# Patient Record
Sex: Female | Born: 1975 | Race: White | Hispanic: No | Marital: Married | Smoking: Never smoker
Health system: Southern US, Community
[De-identification: ages and names within clinical notes are randomized; demographics above are authoritative.]

## PROBLEM LIST (undated history)

## (undated) DIAGNOSIS — J302 Other seasonal allergic rhinitis: Secondary | ICD-10-CM

## (undated) DIAGNOSIS — C439 Malignant melanoma of skin, unspecified: Secondary | ICD-10-CM

## (undated) DIAGNOSIS — Z9889 Other specified postprocedural states: Secondary | ICD-10-CM

## (undated) DIAGNOSIS — Z8619 Personal history of other infectious and parasitic diseases: Secondary | ICD-10-CM

## (undated) HISTORY — PX: DILATION AND CURETTAGE OF UTERUS: SHX78

## (undated) HISTORY — PX: SKIN SURGERY: SHX2413

## (undated) HISTORY — DX: Personal history of other infectious and parasitic diseases: Z86.19

---

## 1999-05-18 ENCOUNTER — Other Ambulatory Visit: Admission: RE | Admit: 1999-05-18 | Discharge: 1999-05-18 | Payer: Self-pay | Admitting: *Deleted

## 2011-01-09 DIAGNOSIS — C439 Malignant melanoma of skin, unspecified: Secondary | ICD-10-CM

## 2011-01-09 HISTORY — DX: Malignant melanoma of skin, unspecified: C43.9

## 2013-01-08 DIAGNOSIS — Z9889 Other specified postprocedural states: Secondary | ICD-10-CM

## 2013-01-08 HISTORY — DX: Other specified postprocedural states: Z98.890

## 2013-08-26 LAB — OB RESULTS CONSOLE GC/CHLAMYDIA
Chlamydia: NEGATIVE
Gonorrhea: NEGATIVE

## 2013-09-05 ENCOUNTER — Ambulatory Visit (INDEPENDENT_AMBULATORY_CARE_PROVIDER_SITE_OTHER): Payer: 59 | Admitting: Physician Assistant

## 2013-09-05 VITALS — BP 124/84 | HR 88 | Temp 98.0°F | Resp 16 | Ht 69.0 in | Wt 171.6 lb

## 2013-09-05 DIAGNOSIS — R350 Frequency of micturition: Secondary | ICD-10-CM

## 2013-09-05 MED ORDER — CEPHALEXIN 500 MG PO CAPS: 500.0000 mg | ORAL_CAPSULE | Freq: Three times a day (TID) | ORAL | Status: DC

## 2013-09-05 NOTE — Progress Notes (Signed)
   Subjective:    Patient ID: [name], female    DOB: 12/09/1975, 38 y.o.   MRN: [id number]  HPI  Pt presents to clinic with concerns that her OB/GYN called her and told her she had a UTI and needed Keflex but she went to the pharmacy and it was not there.  She is [redacted] weeks pregnant and is concerned to go through the wknd without the abx when they called her and told her she needed the abx.  She is nauseated but otherwise feels good.  She is taking PNV.  She has no symptoms of UTI.  Review of Systems  Constitutional: Negative for fever and chills.  Genitourinary: Negative for dysuria, urgency and frequency.       Objective:   Physical Exam  Vitals reviewed. Constitutional: She is oriented to person, place, and time. She appears well-developed and well-nourished.  HENT:  Head: Normocephalic and atraumatic.  Right Ear: External ear normal.  Left Ear: External ear normal.  Pulmonary/Chest: Effort normal.  Neurological: She is alert and oriented to person, place, and time.  Skin: Skin is warm and dry.  Psychiatric: She has a normal mood and affect. Her behavior is normal. Judgment and thought content normal.       Assessment & Plan:  Frequent urination - Plan: cephALEXin (KEFLEX) 500 MG capsule  Due to a phone call from OB/GYN with culture results that showed a UTI that was sensitive to Keflex but they did not get the Rx to the pharmacy.  We will treat as she was instructed over the phone.  Windell Hummingbird PA-C  [facility] 09/05/2013 9:46 AM

## 2013-09-23 LAB — OB RESULTS CONSOLE RUBELLA ANTIBODY, IGM: Rubella: IMMUNE

## 2013-09-23 LAB — OB RESULTS CONSOLE ABO/RH: RH Type: POSITIVE

## 2013-09-23 LAB — OB RESULTS CONSOLE HIV ANTIBODY (ROUTINE TESTING): HIV: NONREACTIVE

## 2013-09-23 LAB — OB RESULTS CONSOLE RPR: RPR: NONREACTIVE

## 2013-09-23 LAB — OB RESULTS CONSOLE ANTIBODY SCREEN: ANTIBODY SCREEN: NEGATIVE

## 2013-09-23 LAB — OB RESULTS CONSOLE HEPATITIS B SURFACE ANTIGEN: Hepatitis B Surface Ag: NEGATIVE

## 2013-12-15 ENCOUNTER — Other Ambulatory Visit (HOSPITAL_COMMUNITY): Payer: Self-pay | Admitting: Obstetrics and Gynecology

## 2013-12-15 DIAGNOSIS — R9389 Abnormal findings on diagnostic imaging of other specified body structures: Secondary | ICD-10-CM

## 2013-12-22 ENCOUNTER — Ambulatory Visit (HOSPITAL_COMMUNITY): Payer: 59

## 2013-12-25 ENCOUNTER — Other Ambulatory Visit (HOSPITAL_COMMUNITY): Payer: Self-pay | Admitting: Obstetrics and Gynecology

## 2013-12-25 ENCOUNTER — Ambulatory Visit (HOSPITAL_COMMUNITY)
Admission: RE | Admit: 2013-12-25 | Discharge: 2013-12-25 | Disposition: A | Discharge status: Home / Self Care | Payer: 59 | Source: Ambulatory Visit | Attending: Obstetrics and Gynecology | Admitting: Obstetrics and Gynecology

## 2013-12-25 ENCOUNTER — Encounter (HOSPITAL_COMMUNITY): Payer: Self-pay

## 2013-12-25 DIAGNOSIS — IMO0002 Reserved for concepts with insufficient information to code with codable children: Secondary | ICD-10-CM | POA: Insufficient documentation

## 2013-12-25 DIAGNOSIS — O283 Abnormal ultrasonic finding on antenatal screening of mother: Secondary | ICD-10-CM | POA: Diagnosis not present

## 2013-12-25 DIAGNOSIS — O358XX0 Maternal care for other (suspected) fetal abnormality and damage, not applicable or unspecified: Secondary | ICD-10-CM | POA: Diagnosis not present

## 2013-12-25 DIAGNOSIS — O09522 Supervision of elderly multigravida, second trimester: Secondary | ICD-10-CM | POA: Diagnosis not present

## 2013-12-25 DIAGNOSIS — R9389 Abnormal findings on diagnostic imaging of other specified body structures: Secondary | ICD-10-CM

## 2013-12-25 DIAGNOSIS — Z3A24 24 weeks gestation of pregnancy: Secondary | ICD-10-CM | POA: Insufficient documentation

## 2013-12-25 DIAGNOSIS — Z315 Encounter for genetic counseling: Secondary | ICD-10-CM | POA: Insufficient documentation

## 2013-12-25 DIAGNOSIS — O4402 Placenta previa specified as without hemorrhage, second trimester: Secondary | ICD-10-CM | POA: Diagnosis not present

## 2013-12-25 DIAGNOSIS — IMO0001 Reserved for inherently not codable concepts without codable children: Secondary | ICD-10-CM

## 2013-12-25 DIAGNOSIS — O359XX1 Maternal care for (suspected) fetal abnormality and damage, unspecified, fetus 1: Secondary | ICD-10-CM

## 2013-12-25 DIAGNOSIS — O09529 Supervision of elderly multigravida, unspecified trimester: Secondary | ICD-10-CM | POA: Insufficient documentation

## 2013-12-25 NOTE — Progress Notes (Signed)
Genetic Counseling  High-Risk Gestation Note  Appointment Date:  12/25/2013 Referred By: Sabrina Cipro, MD Date of Birth:  1975/07/02 Partner:  [name]   Pregnancy History: [id number]  Estimated Date of Delivery: 04/12/14 Estimated Gestational Age: 6w4dAttending: MRenella Cunas MD   I met with Mrs. [name] her husband, [name] for genetic counseling because of abnormal ultrasound findings.  We began by reviewing the ultrasound in detail. Ultrasound performed today confirmed the findings of bilateral enlarged, echogenic kidneys. Remaining visualized fetal anatomy appeared normal, and amniotic fluid volume was visualized to be normal at the time of today's ultrasound. Complete ultrasound results reported separately.   We discussed the possible etiologies for echogenic fetal kidneys including normal variation, polycystic kidney disease, fetal aneuploidy, or other single gene conditions. We reviewed chromosomes, genes, and various patterns of inheritance. Ms. GHarneypreviously had noninvasive prenatal screening (NIPS)/cell free DNA testing performed through her OB office ([facility] through [facility]. We reviewed that these results were within normal limits. We reviewed the conditions for which it screened (trisomies 256 169 13, and sex chromosome aneuploidy) and the sensitivity and specificity for these conditions. They understand that NIPS is not diagnostic for these conditions, nor does it assess for all chromosome or genetic conditions.   We specifically reviewed the association with fetal echogenic kidneys and the chance for underlying polycystic kidney disease. We discussed that there are two types of polycystic kidney disease, delineated by the mode of inheritance: autosomal recessive polycystic kidney disease (ARPKD) and autosomal dominant polycystic kidney disease (ADPKD). ARPKD is characterized by both renal and liver disease, but other organ systems can be  variably affected.  The majority of individuals with ARPKD classically present during the fetal or neonatal period with enlarged echogenic kidneys.  Renal disease is characterized by nephromegaly, hypertension, and renal dysfunction.  Greater than 50% of individuals with ARPKD have end stage renal disease (ESRD) within the first decade of life.  ESRD often requires kidney transplantation.  We discussed that fetal onset of the condition can be associated with enlarged, echogenic kidneys and kidney dysfunction in utero, which can lead to oligohydramnios and pulmonary hypoplasia. Infants with prenatal oligohydramnios and pulmonary hypoplasia have significant risk for early death secondary to respiratory distress.  In addition, we discussed that ARPKD causes hepatobiliary disease, which is characterized by hepatomegaly, splenomegaly, and progressive portal hypertension.  We discussed that ARPKD shows significant variability in the age of onset and the presenting clinical features. Neonatal respiratory support and renal replacement therapies have been reported to increase the long-term survival for infants with the classic presentation of ARPKD to approximately greater than 80%.   We spent time reviewing the autosomal recessive inheritance of ARPKD.  PKHD1 is the only gene known to be associated with ARPKD.  They were counseled that parents of a child with ARPKD are typically carriers of the condition.  A carrier refers to an individual who has one altered gene and one typical functioning copy of the gene.  Carriers of ARPKD are healthy and have no features of ARPKD. Offspring of a carrier couple for an ARPKD would have a 1 in 4 (25%) chance to inherit the condition. We discussed the availability of prenatal diagnosis for ARPKD via amniocentesis via molecular testing for PKHD1.  Molecular testing for PKHD1 is estimated to detect causative genetic mutations in approximately 79-85% of individuals with a clinical  diagnosis of ARPKD. We discussed the risks, benefits, and limitations of amniocentesis including the associated 1 in 300-500  risk for complications, including spontaneous preterm labor and delivery. We also discussed the option of carrier screening for ARPKD. Carrier screening for PKHD1 is available via a pan-ethnic carrier screening panel, which also screens for additional autosomal recessive conditions (unrelated to the ultrasound findings in the pregnancy). We reviewed that carrier screening assesses for common disease causing mutations and thus does not detect all carriers of the condition. We dicussed that detection rate from carrier screening is approximately 45%. They understand that limitations of carrier screening versus assessing a symptomatic individual directly. After careful consideration, [name] carrier screening for ARPKD via pan-ethnic carrier screening panel at this time.   Autosomal dominant PKD (ADPKD) is generally a late-onset disorder characterized by progressive cyst development and bilaterally enlarged polycystic kidneys. These cysts can slowly replace much of the mass of the kidneys, reducing kidney function and leading to kidney failure.  Approximately 50% of individuals with ADPKD have end stage renal disease by age 95 years. End-stage renal disease is variable: the majority of individuals with PKD1 mutations experience ESRD, but many individuals with PKD2 mutations have adequate renal function into old age. The condition is substantially clinically variable, even within families.  We discussed that ADPKD typically has adult onset; however, 1-2% of patients present during the neonatal period, often with signs and symptoms indistinguishable from ARPKD. Fetal presentation of autosomal dominant kidney disease is less common, but is described and suspected particularly when a family history of ADPKD is also present. We also discussed that the absence of a family history of ADPKD  would not rule out the presence of ADPKD given that approximately 10% of cases occur due to a de novo mutation.   We reviewed autosomal dominant inheritance. In autosomal dominant inheritance, the presence of one genetic mutation in a particular gene pair causes the condition. A person who has a dominant genetic condition typically has symptoms of the disease, and each of their pregnancies has a 1 in 2 (50%) chance to inherit the condition. We discussed that each pregnancy for an individual with ADPKD has the same 1 in 2 chance to inherit the condition, regardless of whether or not other offspring have inherited the condition.  About 85% of cases result from an altered gene located on chromosome 16 (PKD1).  The remaining 15% of cases are thought to result from an altered gene on chromosome 4 (PKD2).  Genetic testing is available and is most informative by testing a symptomatic individual first. It is important to realize that genetic testing is not useful in predicting age of onset, severity, or rate of disease progression in individuals. The diagnosis of autosomal dominant polycystic kidney disease is established primarily by renal imaging studies and genetic testing can be used to confirm or establish an uncertain diagnosis.  For adult relatives at risk for ADPKD, initial evaluation offered is typically imaging with abdominal ultrasound, CT, or MRI, or if the familial mutation is known, molecular genetic testing may be performed.  We also discussed the option of molecular testing for PKD1 and/or PKD2 via amniocentesis in the pregnancy. The couple understands that amniocentesis would not diagnose or rule out all genetic conditions. We also discussed that in the case that polycystic kidney disease is present in the pregnancy, the distinction of ARPKD vs ADPKD would not likely significantly impact the postnatal medical management, which is rather dictated by the presenting features for the pregnancy/neonate.  After  careful consideration, [name] amniocentesis at this time given the associated risk of complications.  We discussed that enlarged, hyperechoic kidneys on prenatal ultrasound can also be associated with underlying fetal aneuploidy or other single gene conditions. We also discussed that the ultrasound finding of echogenic kidneys may be a normal variant.  We discussed that prognosis depends upon the underlying etiology of kidney echogenicity, as well as the kidney function and effects on amniotic fluid volume in pregnancy. We reviewed that increased kidney size and low amniotic fluid volume would be more associated with a potential poor prognosis. Recurrence risk for future pregnancies would depend upon the underlying cause for the enlarged, echogenic kidneys. Follow-up ultrasound was recommended in 2 weeks at the patient's OB office to assess amniotic fluid volume. Follow-up ultrasound was planned in 4 weeks (01/22/14) to reassess fetal kidneys, amniotic fluid, and fetal growth. We discussed the option of prenatal consultation with pediatric nephrology or pediatric urology. The couple expressed interest in this, and we will facilitate this for the patient.  They understand that ultrasound and screening do not diagnose or rule out all birth defects or genetic conditions.   Both family histories were reviewed and found to be noncontributory for polycystic kidney disease. The patient reported that her father had a history of hypertension and diabetes and died at age 31 years old from a stroke but was not reported to have concerns with his kidneys. [name], the father of the pregnancy reported no known individuals with polycystic kidney disease in the family history. The patient reported a female paternal first cousin with one kidney. She had limited information regarding this history and whether or not it was determined to be present at birth or later in life. He is otherwise reportedly healthy. 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 Renal agenesis is typically sporadic. However, there are familial cases reported that are consistent with autosomal dominant inheritance. Additionally, prenatal exposures have been reported to be associated with renal agenesis. We also discussed that renal agenesis is described as an underlying feature of many single gene conditions and can also be seen with chromosome conditions. We discussed that in the case of isolated renal agenesis, recurrence risk would be expected to be low for relatives, given that sporadic occurrence is observed in the majority of cases. We discussed that this reported family history would not likely be related to the ultrasound finding in the current pregnancy of echogenic, enlarged fetal kidneys. The family histories were otherwise unremarkable for birth defects, intellectual disability, and known genetic conditions. Consanguinity was denied. Without further information regarding the provided family history, an accurate genetic risk cannot be calculated. Further genetic counseling is warranted if more information is obtained.    I counseled this couple regarding the above risks and available options.  The approximate face-to-face time with the genetic counselor was 35 minutes.  Chipper Oman, MS Certified Genetic Counselor 12/25/2013

## 2013-12-26 DIAGNOSIS — O4440 Low lying placenta NOS or without hemorrhage, unspecified trimester: Secondary | ICD-10-CM | POA: Insufficient documentation

## 2013-12-26 DIAGNOSIS — O09529 Supervision of elderly multigravida, unspecified trimester: Secondary | ICD-10-CM | POA: Insufficient documentation

## 2013-12-26 DIAGNOSIS — Z3A24 24 weeks gestation of pregnancy: Secondary | ICD-10-CM | POA: Insufficient documentation

## 2013-12-26 DIAGNOSIS — O358XX0 Maternal care for other (suspected) fetal abnormality and damage, not applicable or unspecified: Secondary | ICD-10-CM | POA: Insufficient documentation

## 2013-12-26 DIAGNOSIS — O35EXX0 Maternal care for other (suspected) fetal abnormality and damage, fetal genitourinary anomalies, not applicable or unspecified: Secondary | ICD-10-CM | POA: Insufficient documentation

## 2013-12-28 ENCOUNTER — Telehealth (HOSPITAL_COMMUNITY): Payer: Self-pay | Admitting: MS"

## 2013-12-28 NOTE — Telephone Encounter (Signed)
Called Ms. [name] regarding prenatal consultation with pediatric nephrology. Discussed that this is scheduled with Dr. Bridgett Larsson, pediatric nephrologist at [place] [facility]. Appointment is scheduled for Thursday, January 14 at 3:45. Discussed that pediatric nephrology office will mail a packet of information to the patient regarding the upcoming appointment including directions. [name] had previously asked about who to contact with inquiries regarding insurance in regards to a prenatal consultation with pediatric nephrology at [facility]. Provided [name] with the contact number for the office of patient and family relations ([phone]).   [name] stated that she has been reading additional information over the weekend. She stated that she has been trying to contact her OB office this morning regarding scheduling the follow-up ultrasound to assess amniotic fluid volume. Discussed that this recommendation is also included in the ultrasound report from Friday, which was faxed to her OB office. Reviewed also that Dr. Burnett Harry will talk, if not already done, directly with either Artelia Laroche or Dr. Ronita Hipps regarding the patient's visit from 12/18.   [name] stated that her understanding is that there is not a definitive diagnosis but a suspected one based on the ultrasound findings. Reviewed that this is correct and that ultrasound is most suggestive of a form of polycystic kidney disease. We reviewed the option of amniocentesis. Patient inquired about the window of time available to do amnio, and we dicussed that there is not gestational age limit on when amniocentesis can be performed. Reviewed risk of complications including the risk for spontaneous labor and delivery. We reviewed the benefits and limitations of single gene testing from amniocentesis. [name] also inquired about testing for herself or her husband. Reviewed the option of carrier  screening for ARPKD via carrier screening panel. Reviewed the there is a wide range of features included on the panel and the chance to identify carrier status for an unrelated genetic condition. Reviewed that the detection rate for carriers of ARPKD from this carrier screen is approximately 50%. Thus, a negative screen would not rule out carrier status, and also not rule out the presence of ARPKD in the pregnancy. Provided [name] with information regarding one lab that performs this carrier screen ([facility] [employer]) so that she could read more and further consider this option.   Santiago Glad Maalik Pinn 12/28/2013 11:32 AM

## 2013-12-30 ENCOUNTER — Other Ambulatory Visit (HOSPITAL_COMMUNITY): Payer: Self-pay

## 2014-01-08 NOTE — L&D Delivery Note (Signed)
Delivery Note At 3:04 PM a viable and healthy female was delivered via  (Presentation: ROA ).  APGAR: 8, 9; weight  pending.   Placenta status: spontaneous, intact.  Cord:  with the following complications: none.  Cord pH: na NICU in attendance due to fetal history of possible PCKD.  Anesthesia:  local Episiotomy:  none Lacerations:  second Suture Repair: 2.0 vicryl rapide Est. Blood Loss (mL):  200  Mom to postpartum.  Baby to Nursery or possibly NICU to be determined.  [name] J 04/08/2014, 3:21 PM

## 2014-01-11 ENCOUNTER — Telehealth (HOSPITAL_COMMUNITY): Payer: Self-pay | Admitting: MS"

## 2014-01-11 NOTE — Telephone Encounter (Signed)
Called [name], per patient's request, to discuss information from the patient's visit with [facility]. The receptionist informed me that [name] is out of the office today but should be in later this week. I left a message for [name] stating that I was calling to discuss [name], given that Dr. Burnett Harry is on vacation this week, and asked for her to return call to me either at office number or cell phone, when she has a chance.   [name] 01/11/2014 12:08 PM

## 2014-01-11 NOTE — Telephone Encounter (Signed)
Ms. [name] called to follow-up. She stated that she met with her midwife and had ultrasound for amniotic fluid check, which looked good. Ms. [name] has concerns that information between the two offices is not adequately being shared. She stated that [name] and Dr. Burnett Santos have not yet had a chance to talk directly to each other regarding [name] visit in [facility]. Ms. [name] acknowledged that there have been two holidays which can sometimes through things off. I let Ms. [name] know that Dr. Burnett Santos is also on vacation this week. Ms. [name] asked if I could call over and speak directly with [name], given that I have access to the same notes and information from her visit with [place]. Discussed that I would plan to call over to touch base with Ms. [name].   Ms. [name] also inquired about her upcoming appointments. She is scheduled to meet with [name], Dr. Bridgett Santos on 01/21/14 and then has an ultrasound for growth scheduled 01/22/14 at [name]. Ms. [name] wanted clarification on why both appointments were needed. She asked if Santos was the expert then why could they not do diagnostic study. Explained that they do not do prenatal ultrasounds and that the appointment with nephrology is only a consult, primarily to review plans for medical care and treatment after delivery. Discussed that the ultrasound on 01/22/14 was for growth and to specifically reassess kidneys and amniotic fluid and that it was more detailed than an amniotic fluid check. Ms. [name] asked if the nephrology consult could then be moved until after the 01/22/14 ultrasound so that they have the most up to date information and images for the appointment. Discussed that it should be no problem to move that appointment until later. I planned to contact Dr. Lianne Santos office (pediatric nephrology) to reschedule appointment, and let Ms. [name] know that ultrasound images and  the report could be sent to Dr. Bridgett Santos quickly following the 01/22/14 ultrasound.   I planned to call Ms. [name] back on her cell phone with updates regarding this plan.   [name] 01/11/2014 12:06 PM

## 2014-01-12 ENCOUNTER — Telehealth (HOSPITAL_COMMUNITY): Payer: Self-pay | Admitting: MS"

## 2014-01-12 NOTE — Telephone Encounter (Signed)
Called Ms. Margarito Courser Basista back regarding rescheduled prenatal consultation with pediatric nephrology. Offered to call on her behalf or offer her number for her to call directly to reschedule. The patient stated that she would plan to call to reschedule. Contact number for [facility] scheduling is [phone]. Additionally, let patient know that I called over and left voicemail with Artelia Laroche but that she was on vacation yesterday and is supposed to be back in the office today. I will continue to try to touch base with Lavella Lemons at [place].   Also reviewed option of amniocentesis to test three genes associated with PKD. Patient stated that she remembered this option and remembered that detection rate, even in clinical diagnosis of PKD is not 100% and that amniocentesis is also associated with risk in pregnancy. Reviewed that this is correct and that postnatal genetic testing could also be performed. Also reviewed option of carrier screening for ARPKD that this is a noninvasive option but has lower sensitivity. Reviewed that this is available through a panel ([facility] through [place]) and that currently patient's with [employer] are billed up to approximately $100 for this test. Patient asked if it mattered if this testing was performed for female or female partner. Stated that it could be begun with either partner. Stated that I would confirm the current billing policy with the lab if they were considering this option.   Ms. Krauss had updates regarding the family history. She stated that she is still investigating the issues with blood pressure for her husband's other daughter and that her husband's father has diabetes insipidus.   Santiago Glad Aliya Sol 01/12/2014 1:31 PM

## 2014-01-22 ENCOUNTER — Ambulatory Visit (HOSPITAL_COMMUNITY)
Admission: RE | Admit: 2014-01-22 | Discharge: 2014-01-22 | Disposition: A | Discharge status: Home / Self Care | Payer: 59 | Source: Ambulatory Visit | Attending: Family Medicine | Admitting: Family Medicine

## 2014-01-22 ENCOUNTER — Other Ambulatory Visit (HOSPITAL_COMMUNITY): Payer: Self-pay | Admitting: Obstetrics and Gynecology

## 2014-01-22 DIAGNOSIS — O359XX1 Maternal care for (suspected) fetal abnormality and damage, unspecified, fetus 1: Secondary | ICD-10-CM

## 2014-01-22 DIAGNOSIS — O4400 Placenta previa specified as without hemorrhage, unspecified trimester: Secondary | ICD-10-CM | POA: Insufficient documentation

## 2014-01-22 DIAGNOSIS — O09522 Supervision of elderly multigravida, second trimester: Secondary | ICD-10-CM

## 2014-01-22 DIAGNOSIS — O358XX0 Maternal care for other (suspected) fetal abnormality and damage, not applicable or unspecified: Secondary | ICD-10-CM | POA: Diagnosis not present

## 2014-01-22 DIAGNOSIS — Z3A28 28 weeks gestation of pregnancy: Secondary | ICD-10-CM | POA: Insufficient documentation

## 2014-01-22 DIAGNOSIS — O4403 Placenta previa specified as without hemorrhage, third trimester: Secondary | ICD-10-CM | POA: Insufficient documentation

## 2014-01-22 DIAGNOSIS — O359XX0 Maternal care for (suspected) fetal abnormality and damage, unspecified, not applicable or unspecified: Secondary | ICD-10-CM | POA: Insufficient documentation

## 2014-01-22 DIAGNOSIS — O09523 Supervision of elderly multigravida, third trimester: Secondary | ICD-10-CM | POA: Insufficient documentation

## 2014-01-22 DIAGNOSIS — O4402 Placenta previa specified as without hemorrhage, second trimester: Secondary | ICD-10-CM

## 2014-01-25 ENCOUNTER — Other Ambulatory Visit (HOSPITAL_COMMUNITY): Payer: Self-pay | Admitting: Maternal and Fetal Medicine

## 2014-01-25 DIAGNOSIS — O4403 Placenta previa specified as without hemorrhage, third trimester: Secondary | ICD-10-CM

## 2014-01-25 DIAGNOSIS — O359XX0 Maternal care for (suspected) fetal abnormality and damage, unspecified, not applicable or unspecified: Secondary | ICD-10-CM

## 2014-01-25 DIAGNOSIS — O09523 Supervision of elderly multigravida, third trimester: Secondary | ICD-10-CM

## 2014-02-11 ENCOUNTER — Encounter (HOSPITAL_COMMUNITY): Payer: Self-pay

## 2014-02-18 DIAGNOSIS — O283 Abnormal ultrasonic finding on antenatal screening of mother: Secondary | ICD-10-CM | POA: Insufficient documentation

## 2014-02-19 ENCOUNTER — Ambulatory Visit (HOSPITAL_COMMUNITY)
Admission: RE | Admit: 2014-02-19 | Discharge: 2014-02-19 | Disposition: A | Discharge status: Home / Self Care | Payer: 59 | Source: Ambulatory Visit | Attending: Family Medicine | Admitting: Family Medicine

## 2014-02-19 ENCOUNTER — Encounter (HOSPITAL_COMMUNITY): Payer: Self-pay

## 2014-02-19 DIAGNOSIS — O35EXX0 Maternal care for other (suspected) fetal abnormality and damage, fetal genitourinary anomalies, not applicable or unspecified: Secondary | ICD-10-CM | POA: Insufficient documentation

## 2014-02-19 DIAGNOSIS — O09523 Supervision of elderly multigravida, third trimester: Secondary | ICD-10-CM | POA: Insufficient documentation

## 2014-02-19 DIAGNOSIS — Z3A32 32 weeks gestation of pregnancy: Secondary | ICD-10-CM | POA: Diagnosis not present

## 2014-02-19 DIAGNOSIS — O358XX0 Maternal care for other (suspected) fetal abnormality and damage, not applicable or unspecified: Secondary | ICD-10-CM | POA: Diagnosis present

## 2014-02-19 DIAGNOSIS — O4403 Placenta previa specified as without hemorrhage, third trimester: Secondary | ICD-10-CM

## 2014-02-19 DIAGNOSIS — O359XX0 Maternal care for (suspected) fetal abnormality and damage, unspecified, not applicable or unspecified: Secondary | ICD-10-CM

## 2014-02-19 DIAGNOSIS — O09529 Supervision of elderly multigravida, unspecified trimester: Secondary | ICD-10-CM | POA: Insufficient documentation

## 2014-03-10 LAB — OB RESULTS CONSOLE GBS: STREP GROUP B AG: NEGATIVE

## 2014-03-16 ENCOUNTER — Encounter (HOSPITAL_COMMUNITY): Payer: Self-pay

## 2014-04-02 ENCOUNTER — Telehealth (HOSPITAL_COMMUNITY): Payer: Self-pay | Admitting: *Deleted

## 2014-04-02 ENCOUNTER — Encounter (HOSPITAL_COMMUNITY): Payer: Self-pay | Admitting: *Deleted

## 2014-04-02 NOTE — Telephone Encounter (Signed)
Preadmission screen  

## 2014-04-08 ENCOUNTER — Other Ambulatory Visit: Payer: Self-pay | Admitting: Obstetrics and Gynecology

## 2014-04-08 ENCOUNTER — Inpatient Hospital Stay (HOSPITAL_COMMUNITY)
Admission: AD | Admit: 2014-04-08 | Discharge: 2014-04-10 | DRG: 775 | Disposition: A | Discharge status: Home / Self Care | Payer: 59 | Source: Ambulatory Visit | Attending: Obstetrics and Gynecology | Admitting: Obstetrics and Gynecology

## 2014-04-08 ENCOUNTER — Encounter (HOSPITAL_COMMUNITY): Payer: Self-pay | Admitting: *Deleted

## 2014-04-08 DIAGNOSIS — O9081 Anemia of the puerperium: Secondary | ICD-10-CM | POA: Diagnosis not present

## 2014-04-08 DIAGNOSIS — D62 Acute posthemorrhagic anemia: Secondary | ICD-10-CM | POA: Diagnosis not present

## 2014-04-08 DIAGNOSIS — IMO0001 Reserved for inherently not codable concepts without codable children: Secondary | ICD-10-CM

## 2014-04-08 DIAGNOSIS — Z3A39 39 weeks gestation of pregnancy: Secondary | ICD-10-CM | POA: Diagnosis present

## 2014-04-08 DIAGNOSIS — Z8249 Family history of ischemic heart disease and other diseases of the circulatory system: Secondary | ICD-10-CM | POA: Diagnosis not present

## 2014-04-08 DIAGNOSIS — O9989 Other specified diseases and conditions complicating pregnancy, childbirth and the puerperium: Secondary | ICD-10-CM | POA: Diagnosis present

## 2014-04-08 DIAGNOSIS — Z823 Family history of stroke: Secondary | ICD-10-CM | POA: Diagnosis not present

## 2014-04-08 DIAGNOSIS — O4103X0 Oligohydramnios, third trimester, not applicable or unspecified: Secondary | ICD-10-CM | POA: Diagnosis present

## 2014-04-08 HISTORY — DX: Other seasonal allergic rhinitis: J30.2

## 2014-04-08 HISTORY — DX: Other specified postprocedural states: Z98.890

## 2014-04-08 HISTORY — DX: Malignant melanoma of skin, unspecified: C43.9

## 2014-04-08 LAB — CBC
HEMATOCRIT: 36.5 % (ref 36.0–46.0)
HEMOGLOBIN: 12.4 g/dL (ref 12.0–15.0)
MCH: 29.9 pg (ref 26.0–34.0)
MCHC: 34 g/dL (ref 30.0–36.0)
MCV: 88 fL (ref 78.0–100.0)
PLATELETS: 134 10*3/uL — AB (ref 150–400)
RBC: 4.15 MIL/uL (ref 3.87–5.11)
RDW: 13.4 % (ref 11.5–15.5)
WBC: 7.8 10*3/uL (ref 4.0–10.5)

## 2014-04-08 LAB — ABO/RH: ABO/RH(D): O POS

## 2014-04-08 LAB — TYPE AND SCREEN
ABO/RH(D): O POS
Antibody Screen: NEGATIVE

## 2014-04-08 MED ORDER — OXYTOCIN 40 UNITS IN LACTATED RINGERS INFUSION - SIMPLE MED
62.5000 mL/h | INTRAVENOUS | Status: DC
  Administered 2014-04-08: 62.5 mL/h via INTRAVENOUS
  Filled 2014-04-08: qty 1000

## 2014-04-08 MED ORDER — OXYCODONE-ACETAMINOPHEN 5-325 MG PO TABS: 1.0000 | ORAL_TABLET | ORAL | Status: DC | PRN

## 2014-04-08 MED ORDER — LANOLIN HYDROUS EX OINT: TOPICAL_OINTMENT | CUTANEOUS | Status: DC | PRN

## 2014-04-08 MED ORDER — SENNOSIDES-DOCUSATE SODIUM 8.6-50 MG PO TABS
2.0000 | ORAL_TABLET | ORAL | Status: DC
  Administered 2014-04-08 – 2014-04-10 (×2): 2 via ORAL
  Filled 2014-04-08 (×2): qty 2

## 2014-04-08 MED ORDER — OXYTOCIN 40 UNITS IN LACTATED RINGERS INFUSION - SIMPLE MED
1.0000 m[IU]/min | INTRAVENOUS | Status: DC
Start: 2014-04-08 — End: 2014-04-08
  Administered 2014-04-08: 2 m[IU]/min via INTRAVENOUS

## 2014-04-08 MED ORDER — OXYCODONE-ACETAMINOPHEN 5-325 MG PO TABS: 2.0000 | ORAL_TABLET | ORAL | Status: DC | PRN

## 2014-04-08 MED ORDER — ONDANSETRON HCL 4 MG/2ML IJ SOLN: 4.0000 mg | INTRAMUSCULAR | Status: DC | PRN

## 2014-04-08 MED ORDER — ACETAMINOPHEN 325 MG PO TABS: 650.0000 mg | ORAL_TABLET | ORAL | Status: DC | PRN

## 2014-04-08 MED ORDER — ZOLPIDEM TARTRATE 5 MG PO TABS: 5.0000 mg | ORAL_TABLET | Freq: Every evening | ORAL | Status: DC | PRN

## 2014-04-08 MED ORDER — FLEET ENEMA 7-19 GM/118ML RE ENEM: 1.0000 | ENEMA | RECTAL | Status: DC | PRN

## 2014-04-08 MED ORDER — ONDANSETRON HCL 4 MG/2ML IJ SOLN: 4.0000 mg | Freq: Four times a day (QID) | INTRAMUSCULAR | Status: DC | PRN

## 2014-04-08 MED ORDER — ACETAMINOPHEN 325 MG PO TABS
650.0000 mg | ORAL_TABLET | ORAL | Status: DC | PRN
  Administered 2014-04-08: 650 mg via ORAL
  Filled 2014-04-08: qty 2

## 2014-04-08 MED ORDER — ONDANSETRON HCL 4 MG PO TABS: 4.0000 mg | ORAL_TABLET | ORAL | Status: DC | PRN

## 2014-04-08 MED ORDER — DIPHENHYDRAMINE HCL 25 MG PO CAPS: 25.0000 mg | ORAL_CAPSULE | Freq: Four times a day (QID) | ORAL | Status: DC | PRN

## 2014-04-08 MED ORDER — WITCH HAZEL-GLYCERIN EX PADS: 1.0000 "application " | MEDICATED_PAD | CUTANEOUS | Status: DC | PRN

## 2014-04-08 MED ORDER — METHYLERGONOVINE MALEATE 0.2 MG/ML IJ SOLN
0.2000 mg | INTRAMUSCULAR | Status: DC | PRN
Start: 2014-04-08 — End: 2014-04-10

## 2014-04-08 MED ORDER — LACTATED RINGERS IV SOLN
INTRAVENOUS | Status: DC
  Administered 2014-04-08: 12:00:00 via INTRAVENOUS

## 2014-04-08 MED ORDER — SIMETHICONE 80 MG PO CHEW: 80.0000 mg | CHEWABLE_TABLET | ORAL | Status: DC | PRN

## 2014-04-08 MED ORDER — CITRIC ACID-SODIUM CITRATE 334-500 MG/5ML PO SOLN
30.0000 mL | ORAL | Status: DC | PRN
Start: 2014-04-08 — End: 2014-04-08

## 2014-04-08 MED ORDER — OXYTOCIN BOLUS FROM INFUSION: 500.0000 mL | INTRAVENOUS | Status: DC

## 2014-04-08 MED ORDER — LIDOCAINE HCL (PF) 1 % IJ SOLN
30.0000 mL | INTRAMUSCULAR | Status: AC | PRN
  Administered 2014-04-08: 30 mL via SUBCUTANEOUS
  Filled 2014-04-08: qty 30

## 2014-04-08 MED ORDER — OXYCODONE-ACETAMINOPHEN 5-325 MG PO TABS
1.0000 | ORAL_TABLET | ORAL | Status: DC | PRN
Start: 2014-04-08 — End: 2014-04-10

## 2014-04-08 MED ORDER — LACTATED RINGERS IV SOLN: 500.0000 mL | INTRAVENOUS | Status: DC | PRN

## 2014-04-08 MED ORDER — TETANUS-DIPHTH-ACELL PERTUSSIS 5-2.5-18.5 LF-MCG/0.5 IM SUSP: 0.5000 mL | Freq: Once | INTRAMUSCULAR | Status: DC

## 2014-04-08 MED ORDER — IBUPROFEN 600 MG PO TABS
600.0000 mg | ORAL_TABLET | Freq: Four times a day (QID) | ORAL | Status: DC
  Administered 2014-04-08 – 2014-04-10 (×8): 600 mg via ORAL
  Filled 2014-04-08 (×8): qty 1

## 2014-04-08 MED ORDER — PRENATAL MULTIVITAMIN CH
1.0000 | ORAL_TABLET | Freq: Every day | ORAL | Status: DC
  Administered 2014-04-09 – 2014-04-10 (×2): 1 via ORAL
  Filled 2014-04-08 (×2): qty 1

## 2014-04-08 MED ORDER — BENZOCAINE-MENTHOL 20-0.5 % EX AERO
1.0000 "application " | INHALATION_SPRAY | CUTANEOUS | Status: DC | PRN
  Administered 2014-04-08: 1 via TOPICAL
  Filled 2014-04-08: qty 56

## 2014-04-08 MED ORDER — DIBUCAINE 1 % RE OINT: 1.0000 "application " | TOPICAL_OINTMENT | RECTAL | Status: DC | PRN

## 2014-04-08 MED ORDER — METHYLERGONOVINE MALEATE 0.2 MG PO TABS
0.2000 mg | ORAL_TABLET | ORAL | Status: DC | PRN
Start: 2014-04-08 — End: 2014-04-10

## 2014-04-08 MED ORDER — TERBUTALINE SULFATE 1 MG/ML IJ SOLN
0.2500 mg | Freq: Once | INTRAMUSCULAR | Status: DC | PRN
  Filled 2014-04-08: qty 1

## 2014-04-08 NOTE — Progress Notes (Signed)
Delivery of live viable female by Dr Ronita Hipps. NICU at bedside for delivery.

## 2014-04-08 NOTE — MAU Note (Signed)
Urine in lab 

## 2014-04-08 NOTE — MAU Note (Signed)
C/o ?SROM @ 0300 this AM; ucs are about 10 minutres apart;

## 2014-04-08 NOTE — H&P (Signed)
[name] is a 39 y.o. female presenting for SROM at 3am. Maternal Medical History:  Reason for admission: Rupture of membranes.   Contractions: Onset was less than 1 hour ago.   Frequency: irregular.   Perceived severity is mild.    Fetal activity: Perceived fetal activity is normal.   Last perceived fetal movement was within the past hour.    Prenatal complications: Oligohydramnios.   Prenatal Complications - Diabetes: none.    OB History    Gravida Para Term Preterm AB TAB SAB Ectopic Multiple Living   3 1 1  1  1   2      Past Medical History  Diagnosis Date  . Hx of varicella   . Seasonal allergies    Past Surgical History  Procedure Laterality Date  . Dilation and curettage of uterus    . Skin surgery     Family History: family history includes Hypertension in her father; Stroke in her father. Social History:  reports that she has never smoked. She does not have any smokeless tobacco history on file. She reports that she does not drink alcohol or use illicit drugs.   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   All other systems reviewed and are negative.   Dilation: 3 Effacement (%): 50 Station: -2 Exam by:: l. Mcdaniel RN Blood pressure 118/74, pulse 82, temperature 98.3 F (36.8 C), temperature source Oral, resp. rate 18, last menstrual period 07/06/2013. Maternal Exam:  Uterine Assessment: Contraction strength is mild.  Contraction frequency is irregular.   Abdomen: Patient reports no abdominal tenderness. Fetal presentation: vertex  Introitus: Normal vulva. Normal vagina.   Ferning test: positive.  Nitrazine test: positive. Amniotic fluid character: clear.  Pelvis: adequate for delivery.   Cervix: Cervix evaluated by digital exam.     Physical Exam  Nursing note and vitals reviewed. Constitutional: She is oriented to person, place, and time. She appears well-developed and well-nourished.  HENT:  Head: Normocephalic and atraumatic.  Eyes: Pupils are equal, round, and reactive to light.  Neck: Normal range of motion. Neck supple.  Cardiovascular: Normal rate and regular rhythm.   Respiratory: Effort normal and breath sounds normal.  GI: Soft. Bowel sounds are normal.  Genitourinary: Vagina normal and uterus normal.  Musculoskeletal: Normal range of motion.  Neurological: She is alert and oriented to person, place, and time.  Skin: Skin is warm and dry.  Psychiatric: She has a normal mood and affect. Her behavior is normal. Thought content normal.    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   [name] J 04/08/2014, 12:14 PM

## 2014-04-08 NOTE — Lactation Note (Signed)
This note was copied from the chart of [place]. Lactation Consultation Note  Patient Name: [name] INOMV'E Date: 04/08/2014 Reason for consult: Initial assessment of this mom and baby at 5 hours pp.  This is mom's second baby and her 40 yo daughter was breastfed for 1 year.  However, mom says she did have some initial feeding challenges for first 6 weeks.  Per mom, her newborn has latched well but was sleepy when STS at last attempt.  She will try again after bath in about an hour.  LC reviewed frequent STS, cue feedings, normal newborn sleepiness for first 24 hours.  Mom encouraged to feed baby 8-12 times/24 hours and with feeding cues. LC encouraged review of Baby and Me pp 9, 14 and 20-25 for STS and BF information. LC provided [employer] Resource brochure and reviewed [facility] services and list of community and web site resources.    Maternal Data Formula Feeding for Exclusion: No Has patient been taught Hand Expression?:  (experienced mom) Does the patient have breastfeeding experience prior to this delivery?: Yes  Feeding Feeding Type: Breast Fed Length of feed: 5 min  LATCH Score/Interventions              initial LATCH score=8 per RN assessment        Lactation Tools Discussed/Used   STS, cue feedings, normal newborn sleepiness for first 24 hours  Consult Status Consult Status: Follow-up Date: 04/09/14 Follow-up type: In-patient    Sabrina Dresser [facility] 04/08/2014, 8:37 PM

## 2014-04-09 ENCOUNTER — Encounter (HOSPITAL_COMMUNITY): Payer: Self-pay | Admitting: *Deleted

## 2014-04-09 ENCOUNTER — Inpatient Hospital Stay (HOSPITAL_COMMUNITY): Admission: RE | Admit: 2014-04-09 | Payer: 59 | Source: Ambulatory Visit

## 2014-04-09 LAB — CBC
HCT: 30 % — ABNORMAL LOW (ref 36.0–46.0)
Hemoglobin: 10.1 g/dL — ABNORMAL LOW (ref 12.0–15.0)
MCH: 29.8 pg (ref 26.0–34.0)
MCHC: 33.7 g/dL (ref 30.0–36.0)
MCV: 88.5 fL (ref 78.0–100.0)
PLATELETS: 126 10*3/uL — AB (ref 150–400)
RBC: 3.39 MIL/uL — AB (ref 3.87–5.11)
RDW: 13.4 % (ref 11.5–15.5)
WBC: 9.9 10*3/uL (ref 4.0–10.5)

## 2014-04-09 LAB — RPR: RPR Ser Ql: NONREACTIVE

## 2014-04-09 MED ORDER — POLYSACCHARIDE IRON COMPLEX 150 MG PO CAPS
150.0000 mg | ORAL_CAPSULE | Freq: Every day | ORAL | Status: DC
  Administered 2014-04-10: 150 mg via ORAL
  Filled 2014-04-09: qty 1

## 2014-04-09 NOTE — Lactation Note (Signed)
This note was copied from the chart of [place]. Lactation Consultation Note  Patient Name: [name] QMGNO'I Date: 04/09/2014 Reason for consult: Follow-up assessment;Breast/nipple pain;Difficult latch LC assisted baby to latch in football position on (L) breast and LC performed a brief chin tug (FOB shown how) which allowed for a deeper latch.  Mom was wearing shells and both nipples were everted prior to latch.  Baby woke up when undressed and was cuing but initially reluctant to open mouth wide.  Once latched, he sustained latch and rhythmical sucking bursts for about 15 minutes and swallows were noted at intervals.  Baby slipped off on his own and the nipple is not flattended or pinched but there is an area of nipple surface which is slightly puffy compared to surrounding tissue.  (R) nipple has some superficial bruising and redness on tip, so LC provided comfort gelpads for use between feedings.  LC did not initiate pump at this time but RN, Raquel Sarna said she will assist later, if needed.  For now, mom can cue feed ad lib on at least one breast and to call for further latch assistance, if needed.  Baby remained asleep after swaddling and being held by FOB.  Mom able to obtain her personal breast pump from [facility]. RN, Raquel Sarna informed of feeding assessment.   Maternal Data    Feeding Feeding Type: Breast Fed Length of feed: 15 min  LATCH Score/Interventions Latch: Grasps breast easily, tongue down, lips flanged, rhythmical sucking. (assisted with brief chin tug as he latched to assure deep latch)  Audible Swallowing: Spontaneous and intermittent  Type of Nipple: Everted at rest and after stimulation  Comfort (Breast/Nipple): Filling, red/small blisters or bruises, mild/mod discomfort  Problem noted: Mild/Moderate discomfort Interventions (Mild/moderate discomfort): Pre-pump if needed;Hand expression;Comfort gels  Hold (Positioning): Assistance needed to  correctly position infant at breast and maintain latch. Intervention(s): Breastfeeding basics reviewed;Support Pillows;Position options;Skin to skin  LATCH Score: 8 (LC assisted and observed)  Lactation Tools Discussed/Used Pump Review: Setup, frequency, and cleaning;Milk Storage Initiated by:: LC, Junious Dresser Date initiated:: 04/09/14 Signs of proper latch Chin tug Comfort gelpads  Consult Status Consult Status: Follow-up Date: 04/10/14 Follow-up type: In-patient    Junious Dresser [facility] 04/09/2014, 2:27 PM

## 2014-04-09 NOTE — Progress Notes (Signed)
PPD #1- SVD  Subjective:   Reports feeling good Tolerating po/ No nausea or vomiting Bleeding is light Pain controlled with Motrin Up ad lib / ambulatory / voiding without problems Newborn: breastfeeding  / Circumcision: planning-after cleared by peds, may do outpt   Objective:   VS:  VS:  Filed Vitals:   04/08/14 1733 04/08/14 1825 04/08/14 2230 04/09/14 0510  BP: 124/68 112/69 106/58 110/60  Pulse: 80 78 81 68  Temp: 98 F (36.7 C) 97.2 F (36.2 C) 98.9 F (37.2 C) 98.1 F (36.7 C)  TempSrc: Oral Oral Oral Oral  Resp: 18 18 20 18   Height:      Weight:      SpO2:   98%     LABS:  Recent Labs  04/08/14 1130 04/09/14 0552  WBC 7.8 9.9  HGB 12.4 10.1*  PLT 134* 126*   Blood type: --/--/O POS, O POS (03/31 1130) Rubella: Immune (09/16 0000)   I&O: Intake/Output      03/31 0701 - 04/01 0700 04/01 0701 - 04/02 0700   Blood 300    Total Output 300     Net -300            Physical Exam: Alert and oriented x3 Abdomen: soft, non-tender, non-distended  Fundus: firm, non-tender, U-2 Perineum: Well approximated, no significant erythema, edema, or drainage; healing well. Lochia: small Extremities: no edema, no calf pain or tenderness    Assessment:  PPD #1 G3P2012/ S/P:spontaneous vaginal, 2nd degree laceration ABL anemia-mild  Doing well    Plan: Start Niferex Continue routine post partum orders Anticipate D/C home tomorrow   Julianne Handler, N MSN, CNM 04/09/2014, 10:02 AM

## 2014-04-09 NOTE — Lactation Note (Signed)
This note was copied from the chart of [place]. Lactation Consultation Note  Patient Name: [name] XTGGY'I Date: 04/09/2014 Reason for consult: Follow-up assessment;Difficult latch;Other (Comment) (baby with echogenic kidneys per MD and ultrasound) Baby has only had one sustained breastfeed and 2 spoon feeds of ebm and is almost 84 hours old; mom needs to initiate pumping to preserve her milk supply and to have ebm to offer baby as needed.  Baby is asleep but swaddled in open crib on his back, so mom will unwrap him and place him STS.  LC obtained a DEBP and will review assembly, use and cleaning and recommend mom pump q3h until baby feeding consistently (8-12 feedings per 24 hours).  Maternal Data    Feeding Feeding Type: Breast Fed  LATCH Score/Interventions            Initial LATCH score=8 after delivery but only brief latches and some spoon feedings today (2 ml's by spoon x2)          Lactation Tools Discussed/Used Pump Review: Setup, frequency, and cleaning;Milk Storage Initiated by:: LC, Junious Dresser Date initiated:: 04/09/14  Waking techniques Achieving a deep latch Nipple care  Consult Status Consult Status: Follow-up Date: 04/09/14 Follow-up type: In-patient    Junious Dresser [facility] 04/09/2014, 1:35 PM

## 2014-04-10 MED ORDER — IBUPROFEN 600 MG PO TABS: 600.0000 mg | ORAL_TABLET | Freq: Four times a day (QID) | ORAL | Status: DC

## 2014-04-10 MED ORDER — PRAMOXINE-HC 1-2.5 % EX CREA: TOPICAL_CREAM | Freq: Three times a day (TID) | CUTANEOUS | Status: DC

## 2014-04-10 MED ORDER — OXYCODONE-ACETAMINOPHEN 5-325 MG PO TABS: 1.0000 | ORAL_TABLET | ORAL | Status: DC | PRN

## 2014-04-10 NOTE — Lactation Note (Signed)
This note was copied from the chart of [name]. Lactation Consultation Note  Patient Name: [name] MPNTI'R Date: 04/10/2014 Follow up [facility] visit  - per mom the baby has been latching, but not consistent with staying latched. Per mom last fed at 0930 for 40 mins . Per mom was given a medium sized nipple shield last night and has used it some. LC assessed breast tissue with moms permission , noted semi compressible areolas ( areola edema )  And a short shaft nipple. LC discussed options for latching due to edema - ( option #1 after breast massage , hand express, pre-pump either  With hand pump or DEBP ( per mom has a DEBP at home ). To make the nipple and areola complex more elastic for a deeper latch , also if having  To use the Nipple shield for latching will be able to increase to the #24. Mom has been using shells between feedings and appear to be helping. Also gave mom a curved tip syringe so when she gets milk pumping or if needing to supplement can instilled the EBM or formula into the top of the Nipple Shield for an appetizer.  Option #2 - prior to latching , breast massage, hand express, pre-pump 3-5 mins or 8-10 strokes ( hand pump ) , apply Nipple shield ( if the #20 NS to snug , increase to #24 )  And instill EBM or formula into the top of the NS. LC also recommended post pumping after feeding for either option to establish and protect milk supply for 10 -15 mins at least 4 times. If having to use Option #3 - for no latch at the breast with or without a nipple shield , baby needs to fed - use a medium based broad based nipple ( Medela or Dr. Owens Shark ) to feed the baby and pump both breast 15 -20 mins to establish and protect milk supply. Explained to mom the texture of the Nipple shield is the same as the artifical nipple.  Reviewed sore nipple and engorgement prevention and tx. Instructed on the comfort gels for 6 days. Mom already using the shells. LC recommended follow up  with [facility] a [place] [facility]. And mom requested to wait until she new what her 67 apt would be this week ( per mom would know on Monday and would plan to call [facility] office ).    Maternal Data Has patient been taught Hand Expression?:  (discussed hand expressing )  Feeding    LATCH Score/Interventions                Intervention(s): Breastfeeding basics reviewed     Lactation Tools Discussed/Used Tools: Shells;Nipple Shields Nipple shield size: 20;24 Shell Type: Inverted   Consult Status Consult Status: Complete Date: 04/10/14    Myer Haff 04/10/2014, 1:39 PM

## 2014-04-10 NOTE — Progress Notes (Signed)
PPD 2 SVD  S:  Reports feeling well - better than expected             Tolerating po/ No nausea or vomiting             Bleeding is light             Pain controlled with Motrin and percocet occasionally             Up ad lib / ambulatory / voiding QS Newborn breast-feeding  / Circumcision completed / stable status - follow up with pediatric nephrology this week - Peds appointment Monday  O:               VS: BP 108/59 mmHg  Pulse 70  Temp(Src) 98 F (36.7 C) (Oral)  Resp 18  Ht 5\' 8"  (1.727 m)  Wt 90.719 kg (200 lb)  BMI 30.42 kg/m2  SpO2 98%  LMP 07/06/2013  Breastfeeding? Unknown   LABS:              Recent Labs  04/08/14 1130 04/09/14 0552  WBC 7.8 9.9  HGB 12.4 10.1*  PLT 134* 126*               Blood type: --/--/O POS, O POS (03/31 1130)  Rubella: Immune (09/16 0000)                           Physical Exam:             Alert and oriented X3  Abdomen: soft, non-tender, non-distended              Fundus: firm, non-tender, Ueven  Perineum: no edema  Lochia: light  Extremities: trace edema, no calf pain or tenderness    A: PPD # 2 with 2nd degree repair             Newborn with polycystic kidneys - stable   Doing well - stable status  P: Routine post partum orders  DC home  Artelia Laroche CNM, MSN, [facility] 04/10/2014, 11:32 AM

## 2014-04-10 NOTE — Discharge Summary (Signed)
Obstetric Discharge Summary  Reason for Admission: onset of labor at 39 weeks with SROM Prenatal Procedures: NST, ultrasound and MFM and pediatric nephrology consultation                                         neonatal polycystic kidneys Intrapartum Procedures: spontaneous vaginal delivery Postpartum Procedures: none Complications-Operative and Postpartum: 2nd degree perineal laceration HEMOGLOBIN  Date Value Ref Range Status  04/09/2014 10.1* 12.0 - 15.0 g/dL Final    Comment:    DELTA CHECK NOTED REPEATED TO VERIFY    HCT  Date Value Ref Range Status  04/09/2014 30.0* 36.0 - 46.0 % Final    Physical Exam:  General: alert, cooperative and no distress Lochia: appropriate Uterine Fundus: firm Incision: healing well DVT Evaluation: No evidence of DVT seen on physical exam.  Discharge Diagnoses: Term Pregnancy-delivered and mild ABL anemia  Discharge Information: Date: 04/10/2014 Activity: pelvic rest Diet: routine Medications: PNV, Ibuprofen, Percocet and hemorrhoid cream and magnesium PRN Condition: stable Instructions: refer to practice specific booklet Discharge to: home Follow-up Information    Follow up with Artelia Laroche, CNM. Schedule an appointment as soon as possible for a visit in 6 weeks.   Specialty:  Obstetrics and Gynecology   Contact information:   [place] [address] [phone]       Newborn Data: Live born female  Birth Weight: 8 lb 8 oz (3856 g) APGAR: 8, 9 Circumcision completed Home with mother - Peds appointment Monday with pediatric nephrology appointment pending.  Artelia Laroche 04/10/2014, 11:38 AM

## 2014-04-10 NOTE — Discharge Instructions (Signed)
May use magnesium 200-400mg  to prevent constipation

## 2014-04-16 ENCOUNTER — Ambulatory Visit (HOSPITAL_COMMUNITY)
Admission: RE | Admit: 2014-04-16 | Discharge: 2014-04-16 | Disposition: A | Discharge status: Home / Self Care | Payer: 59 | Source: Ambulatory Visit | Attending: Obstetrics and Gynecology | Admitting: Obstetrics and Gynecology

## 2014-04-16 NOTE — Lactation Note (Signed)
Lactation Consult  Mother's reason for visit:  Mom would like LC to assist with latch. Baby is not gaining weight as expected.  Visit Type:  Outpatient Appointment Notes:  Mom is here for feeding assessment. Mom had [facility] at [facility] on [facility] 04/12/14 and then again on Wednesday, 04/13/13 and baby had not gained any weight. Baby weight at that visit was 7 lb. 15 oz. Baby Frankey Poot is now 12 days old.  Mom reports baby Frankey Poot is not staying on the breast for very long. She has had cracked/bleeding nipples but they are now starting to heal, she feels the latch is improving. Mom reports she had difficulty BF her 1st baby initially - baby did not latch well and her milk supply decreased requiring her to supplement and post pump - but by 6 weeks baby was nursing well and her milk supply increased. She is concerned this baby is following the same pattern.  Consult:  Initial Lactation Consultant:  Katrine Coho  ________________________________________________________________________  86 Name: Jonnie Finner Date of Birth: 04/08/2015 Pediatrician: [facility] Gender: female Gestational Age: [redacted]w[redacted]d (At Birth) Birth Weight: 8 lb 8 oz (3856 g) Weight at Discharge: Weight: 8 lb 0.2 oz (3635 g)Date of Discharge: 04/10/2014 Filed Weights   04/08/14 1504 04/08/14 2310 04/09/14 2308  Weight: 8 lb 8 oz (3856 g) 8 lb 7.1 oz (3830 g) 8 lb 0.2 oz (3635 g)   Last weight taken from location outside of [facility]: 04/14/14  7 lb. 15 oz. Location:Pediatrician's office Weight today: 7 lb. 11.2 oz/3494 gm.      ________________________________________________________________________  Mother's Name: Leafy Ro Traeger Type of delivery:  SVB Breastfeeding Experience:  See above note Maternal Medical Conditions:  Melanoma Maternal Medications:  PNV, Fenugreek  ________________________________________________________________________  Breastfeeding History (Post  Discharge)  Frequency of breastfeeding:  Every 2-3 hours Duration of feeding:  12-15 minutes        Mom just started pumping yesterday. She pumped 5 times yesterday receiving 20-30 ml which she has been giving back to the baby via bottle.   Mom has Medela PNS. Prefers using a bottle to supplement  Infant Intake and Output Assessment  Voids:  10 in 24 hrs.  Color:  Clear yellow Stools:  7 in 24 hrs.  Color:  Yellow  ________________________________________________________________________  Maternal Breast Assessment  Breast:  Soft Nipple:  Scabs. Mom reports nipples had been cracked/bleeding but are healing.  Pain level:  3   _______________________________________________________________________ Feeding Assessment/Evaluation  Initial feeding assessment:  Infant's oral assessment:  Variance. Baby is noted to have short labial frenulum, short posterior lingual frenulum. Dimpling end of tongue with crying. Some tongue restriction noted with lateral movement. Baby chews when suckling on LC finger. Does not extend tongue past the lower gum line.   Positioning:  Football Right breast  LATCH documentation: LC assisted Mom with positioning, demonstrating how to obtain more depth with latch and keeping baby engaged at the breast. Demonstrated how to keep lips well flanged when baby is at the breast. Demonstrated massage and how to keep baby awake at the breast. Mom's reported some discomfort with initial latch that resolved with baby nursing. Baby demonstrated nutritive suckling pattern with some intermittent chewing while nursing. Slight compression line visible when baby came off the right breast.     Attached assessment:  Deep  Lips flanged:  Yes.    Lips untucked:  Yes.    Suck assessment:  Nutritive  Tools:  Bottle Instructed on  use and cleaning of tool:  Yes.    Pre-feed weight:  3494 g  (7 lb. 11.2 oz.) Post-feed weight:  3530 g (7 lb. 12.5 oz.) Amount transferred:  36 ml  with nursing for 20 minutes.    Additional Feeding Assessment -   Infant's oral assessment:  Variance  Positioning:  Football Left breast  LATCH documentation: With latching on the left breast, assisted Mom with positioning. Again reviewed how to obtain depth with initial latch, keeping lips flanged. PS initially a 6 improving to 4. Mom reports baby has more difficulty latching to the left breast but at this feeding baby latched without difficulty using breast compression. Baby demonstrated a nutritive suckling pattern, some intermittent chewing noted.   Attached assessment:  Deep  Lips flanged:  Yes.    Lips untucked:  Yes.    Suck assessment:  Nutritive  Tools:  Bottle Instructed on use and cleaning of tool:  Yes.    Pre-feed weight:  3530 g  (7 lb. 12.5 oz.) Post-feed weight:  3554 g (7 lb. 13.4 oz.) Amount transferred:  24 ml  With nursing for 16 minutes  Total amount transferred:  60 ml  Mom concerned that baby lost more weight in the past 2 days according to pre-feed weight. Baby however transferred 60 ml at this visit. Mom reports she has been taking baby off the breast once he became sleepy and now knows to keep baby awake and engaged at the breast for longer periods of time using techniques learned today.  Plan discussed with Mom: BF with each feeding, at least 8-12 times in 24 hours, keeping baby actively nursing for 15-20 minutes both breasts if possible each feeding.  Post pump at least 4-6 times day for 15-20 minutes, give baby back any amount of EBM she receives. She prefer to use bottles. If baby not sustaining the latch and staying engaged at the breast then Mom should supplement each feeding 20-30 ml of EBM or formula. Encouraged Mom to come to support group - Monday night for pre/post weight check [facility] RN visiting on Tuesday next week. OP f/u with Lactation scheduled for Wednesday 04/21/14 at 10:30 am.

## 2014-04-21 ENCOUNTER — Ambulatory Visit (HOSPITAL_COMMUNITY)
Admission: RE | Admit: 2014-04-21 | Discharge: 2014-04-21 | Disposition: A | Discharge status: Home / Self Care | Payer: 59 | Source: Ambulatory Visit | Attending: Obstetrics and Gynecology | Admitting: Obstetrics and Gynecology

## 2014-04-22 NOTE — Lactation Note (Signed)
Lactation Consult  Mother's reason for visit:  Follow up visit Visit Type:  Weight check Appointment Notes:  Slow weight gain Consult:  Follow-Up Lactation Consultant:  Ave Filter  ________________________________________________________________________  [name] Name: [name] Date of Birth: 04/08/2015 Pediatrician: [name] Gender: female Gestational Age: [redacted]w[redacted]d (At Birth) Birth Weight: 8 lb 8 oz (3856 g) Weight at Discharge: Weight: 8 lb 0.2 oz (3635 g)Date of Discharge: 04/10/2014 Filed Weights   04/08/14 1504 04/08/14 2310 04/09/14 2308  Weight: 8 lb 8 oz (3856 g) 8 lb 7.1 oz (3830 g) 8 lb 0.2 oz (3635 g)   Last weight taken from location outside of [facility]: 7-11 on 04/16/14 Location:Hospital Weight today: 8-2.2     ________________________________________________________________________  Mother's Name: [name] Route Type of delivery:  Vaginal Breastfeeding Experience:  Early challenges with first Maternal Medical Conditions:  mastitis left breast Maternal Medications:  Doxicillin  ________________________________________________________________________  Breastfeeding History (Post Discharge)  Frequency of breastfeeding:  Every 2-3 hours   Supplementation   Breastmilk/Formula Volume 30-22ml Frequency:  Every 3 hours  Method:  Bottle,   Pumping  Type of pump:  Unknown Frequency:  Every 3 hours Volume:  10-25ml  Infant Intake and Output Assessment  Voids:qs in 24 hrs.  Color:  Clear yellow Stools:  qs in 24 hrs.  Color:  Yellow  ________________________________________________________________________  Maternal Breast Assessment  Breast:  Soft Nipple:  Erect  _______________________________________________________________________ Feeding Assessment/Evaluation:  Mom and 32 day old baby here for weight check and to evaluate milk transfer.  Mom was seen last week and given a plan she has been following.  Baby was  slow to gain but with supplementation baby gained 7 ounces/5 days.  Mom came to appointment 30 minutes early and baby was hungry so mom fed baby in second consultation room while I was completing another appointment.  Baby fed for 30 minutes and had just come off breast when I arrived.  Nipple looked pinched when baby came off.  He transferred 30 mls from right breast.  Mom was diagnosed with left breast mastitis 3 days ago and she has been taking antibiotic, tylenol and ibuprofen since dx.  She reports feeling like she had the flu with fever, chills and sweats.  She was in bed yesterday but feels somewhat better today although she has not noticed any improvement in the redness, inflammation and pain in left breast.  On exam almost entire left breast is red, warm and inflamed.  This area extends into the axilla area.  She has been using heat and attempting to pump every 3 hours although only obtaining drops.  I instructed mom to call Dr. Kennith Santos office to be seen ASAP.  Recommended she continue heat, massage and regular pumping.  I suggested she try to use her manual pump for possible better flow.  Stressed importance of much rest and plenty of fluids.  She will continue plan and call after mastitis is resolved.  Initial feeding assessment:  Infant's oral assessment: Not assessed today Positioning:  Football Right breast  LATCH documentation:Unable to assess    Pre-feed weight:  3692 g Post-feed weight:  3722 g Amount transferred:  30 ml Amount supplemented:  30 ml     Total amount transferred:  30 ml Total supplement given:  30 ml

## 2014-04-28 ENCOUNTER — Ambulatory Visit (HOSPITAL_COMMUNITY)
Admission: RE | Admit: 2014-04-28 | Discharge: 2014-04-28 | Disposition: A | Discharge status: Home / Self Care | Payer: 59 | Source: Ambulatory Visit | Attending: Obstetrics and Gynecology | Admitting: Obstetrics and Gynecology

## 2014-04-28 NOTE — Lactation Note (Signed)
Lactation Consult  Mother's reason for visit: F/U for Mastisitis Visit Type:  Feeding assessment and re- latching on the left breast ( S/P mastitis left breast )  Appointment Notes:  Confirmed  Consult:  Follow-Up Lactation Consultant:  Myer Haff  ________________________________________________________________________  [name] Name: [name] Date of Birth: 04/08/2015 Pediatrician: Dr. Einar Gip - [facility]  Gender: female Gestational Age: [redacted]w[redacted]d (At Birth) Birth Weight: 8 lb 8 oz (3856 g) Weight at Discharge: Weight: 8 lb 0.2 oz (3635 g)Date of Discharge: 04/10/2014 Filed Weights   04/08/14 1504 04/08/14 2310 04/09/14 2308  Weight: 8 lb 8 oz (3856 g) 8 lb 7.1 oz (3830 g) 8 lb 0.2 oz (3635 g)   Last weight taken from location outside of [facility]: 8-6.5 oz last Friday  4/15 per mom  Location:[facility] Weight today: 8-15.9 oz , 4080 g      ________________________________________________________________________  Mother's Name: [name] Type of delivery:  Vag delivery  Breastfeeding Experience:  Breast fed 1st baby  Maternal Medical Conditions:  No risk factors for milk supply , except S/P mastitis   Maternal Medications:  PNV , still on Keflex for Mastitis tx 1st antibiotic wasn't working. ( Doxicillin )  Was taking Fenugreek , but recently stopped until the mastitis clears   ________________________________________________________________________  Breastfeeding History (Post Discharge)  Frequency of breastfeeding:  8X's a day on the right breast , about every 3 hours , attempting on the left with a nipple shield without much luck  Duration of feeding:  Every 3 hours  Supplementing:Per mom with EBM or formula from a bottle ( Medela ) up to 2 oz after feeding at the breast   Pumping: Per mom with a Tommie Tippie hand pump ( per mom mentioned the hand pump seemed to work better to get the flow started)  LC looked at  the lumen of the Tomie Tippie and felt it appeared to be to narrow for the size of moms nipple and was concerned of increased soreness.  Also so LC recommended 1st hand expressing, hand pump to get the flow going and then switch out to her DEBP to make sure she is softening the breast  To assist in the resolution of the mastitis and to protect and enhance her milk supply on the left.   Per mom pumping 6X/day every 4-5 hours   Infant Intake and Output Assessment  Voids:  8 plus  in 24 hrs.  Color:  Clear yellow Stools:  4  in 24 hrs.  Color:  Brown and Yellow  ________________________________________________________________________  Maternal Breast Assessment -  Right breast full , areola compressible , no signs of  plugged ducts or mastitis ,  left breast full , still pinky red above the areola and warm to touch , areolo  Tough , and after massage , and reverse pressure semi compressible.  ( per mom has been using a hand pump and only getting 15 - 30 ml yield at a time  Breast:  Full Nipple:  Erect Pain level:  0 Pain interventions:  Expressed breast milk  _______________________________________________________________________ Feeding Assessment/Evaluation  Initial feeding assessment:  Infant's oral assessment:  Variance - short labial frenulum ( able to stretch upper lip some, when latched has a tendency to  roll upper lip under some  , and short posterior lingual frenulum, does extend tongue over gum line short distance)   Positioning:  Football Right breast  LATCH documentation:  Latch:  2 = Grasps breast  easily, tongue down, lips flanged, rhythmical sucking.  Audible swallowing:  2 = Spontaneous and intermittent  Type of nipple:  2 = Everted at rest and after stimulation  Comfort (Breast/Nipple):  1 = Filling, red/small blisters or bruises, mild/mod discomfort  Hold (Positioning):  2 = No assistance needed to correctly position infant at breast  LATCH score:  9    Attached assessment:  Shallow  ( worked on depth with breast compressions, obtained and mom comfortable with 20 min feeding   Lips flanged:  No. ( LC flipped upper lip to flanged position ) breast compressions also helped   Lips untucked:  Yes.    Suck assessment:  Nutritive  Tools:  None with latch on the right breast   Instructed on use and cleaning of tool:  No.  Pre-feed weight:  4080 g, 8-15.9 oz  Post-feed weight:  4148 g , 9-2.3 oz  Amount transferred: 68 ml  Amount supplemented:  None   Additional Feeding Assessment - After feeding on the right breast baby [name] seemed satisfied , worked on latching on the left with a NS to  Assist mom to improve let down and soften tissue.   Infant's oral assessment:  Variance- see above note   Positioning:  Football Left breast  LATCH documentation:   Latch:  1 = Repeated attempts needed to sustain latch, nipple held in mouth throughout feeding, stimulation needed to elicit sucking reflex.  Audible swallowing:  1 = A few with stimulation  Type of nipple:  1 = Flat  Comfort (Breast/Nipple):  1 = Filling, red/small blisters or bruises, mild/mod discomfort  Hold (Positioning):  1 = Assistance needed to correctly position infant at breast and maintain latch  LATCH score:  5   Attached assessment:  Shallow @ 1st , with assist improved   Lips flanged:  No.  Lips untucked:  Yes.    Suck assessment:  Nutritive and Nonnutritive  Tools:  Nipple shield 24 mm and Curved tip syringe Instructed on use and cleaning of tool:  Yes.     Wet diaper changed and re- weight   Pre-feed weight:  4140 g , 9.2.0 oz  Post-feed weight: ( after feed weight not obtained due to grandma changing diaper ) would not have been accurate  Amount transferred  Amount supplemented: 4 ml as an appetizer to latch the baby with NS ,  mom knows to supplement 1 1/2 - 2 oz after feeding on the right is if the baby doesn't latch on the left breast   Baby [name] latched with  the #24 Nipple shield on the left breast and instilled Formula into the top with a curved tip syringe  Latched well with depth and a consistent pattern with intermittent swallows, increased with breast compressions, but not the amount swallows  Noted on the right breast. LC suspects milk supply is down due to the mastitis. Breast seemed less warm, softer, less red and baby was able to pull nipple up into the Nipple shield. Per mom was comfortable with latch. See Options with LC plan below . Mom seemed excited baby was able to latch on the left breast .  LC discussed and stressed the importance of starting with hand expressing , then hand pump to get the milk flowing , and then her DEBP if the baby isn't able to latch .  Per mom has been primarily using hand pump on the left breast. ( LC stressed importance of proper stimulation>   Total amount  pumped post feed:  Hand expressed and used hand pump ( Medela ) only at consult ( right breast )   Total amount transferred:  68 ml  Total supplement given:  4 ml   Lactation Impression - Baby latches well on the right breast without the NS, left breast , mastitis still resolving, ( see above note )  LC stressed if the let down doesn't increase with the [facility] plan and latching , and the breast is softening down , still warm to touch,  Elevated temp , chills, to call Dr. Kennith Maes office for another round of antibiotics. Baby is gaining well.  Lactation Plan of Care:  Per mom will be F/U for Pedis visit on 5/2  Praised mom for her efforts breast feeding  And pumping Mom - Rest , naps , plenty fluids, especially water, nutritious snacks and meals  Sore nipple prevention- If to full to start - need to release breast by hand expressing, or pre-pump with hand pump  Expressed milk to nipples  For the dry areas - coconut oil , and especially before pumping  Growth Spurts - 3 weeks , 6 weeks, 3 months , 6 months, Cluster feeding normal  Engorgement Prevention /  plugged ducts/ Mastitis  Important to make sure "[name] " gets a deep latch , and good position aliment  Rotating between at least 2 positions enhances milk supply , preventive measure Breast compressions valuable  Until the left breast is clear of infection , and the areola more compressible  for a deep latch use #24 NS , and instill  EBM or formula into the top .  Expressing and pumping - 1st hand express, 2nd use hand pump 3rd the DEBP .  Center pump pieces and avoid watching them when pumping.   Stressed to mom the importance of consistent removal of milk from the breast every 2 -3 hours to protect milk supply.

## 2015-01-12 DIAGNOSIS — F411 Generalized anxiety disorder: Secondary | ICD-10-CM | POA: Diagnosis not present

## 2015-02-17 DIAGNOSIS — F411 Generalized anxiety disorder: Secondary | ICD-10-CM | POA: Diagnosis not present

## 2015-02-18 MED FILL — predniSONE 20 MG TABS: 20 | 3 days supply | Qty: 6 | Fill #0

## 2015-02-18 MED FILL — VENTOLIN HFA 90 MCG INHALER: 108 (90 BAS | 30 days supply | Qty: 18 | Fill #0

## 2015-02-25 MED FILL — AMOX TR-K CLV 875-125 MG TA: 875-125 | 10 days supply | Qty: 20 | Fill #0

## 2015-03-21 DIAGNOSIS — F411 Generalized anxiety disorder: Secondary | ICD-10-CM | POA: Diagnosis not present

## 2015-04-10 DIAGNOSIS — F411 Generalized anxiety disorder: Secondary | ICD-10-CM | POA: Diagnosis not present

## 2015-04-28 DIAGNOSIS — F411 Generalized anxiety disorder: Secondary | ICD-10-CM | POA: Diagnosis not present

## 2015-05-12 DIAGNOSIS — D225 Melanocytic nevi of trunk: Secondary | ICD-10-CM | POA: Diagnosis not present

## 2015-05-12 DIAGNOSIS — D224 Melanocytic nevi of scalp and neck: Secondary | ICD-10-CM | POA: Diagnosis not present

## 2015-05-12 DIAGNOSIS — D2262 Melanocytic nevi of left upper limb, including shoulder: Secondary | ICD-10-CM | POA: Diagnosis not present

## 2015-05-12 DIAGNOSIS — D2271 Melanocytic nevi of right lower limb, including hip: Secondary | ICD-10-CM | POA: Diagnosis not present

## 2015-05-12 DIAGNOSIS — D2261 Melanocytic nevi of right upper limb, including shoulder: Secondary | ICD-10-CM | POA: Diagnosis not present

## 2015-05-12 DIAGNOSIS — Z85820 Personal history of malignant melanoma of skin: Secondary | ICD-10-CM | POA: Diagnosis not present

## 2015-05-12 DIAGNOSIS — D2272 Melanocytic nevi of left lower limb, including hip: Secondary | ICD-10-CM | POA: Diagnosis not present

## 2015-06-13 DIAGNOSIS — F411 Generalized anxiety disorder: Secondary | ICD-10-CM | POA: Diagnosis not present

## 2015-07-05 DIAGNOSIS — F411 Generalized anxiety disorder: Secondary | ICD-10-CM | POA: Diagnosis not present

## 2015-07-25 DIAGNOSIS — F411 Generalized anxiety disorder: Secondary | ICD-10-CM | POA: Diagnosis not present

## 2015-08-25 DIAGNOSIS — F411 Generalized anxiety disorder: Secondary | ICD-10-CM | POA: Diagnosis not present

## 2015-09-13 DIAGNOSIS — Z0000 Encounter for general adult medical examination without abnormal findings: Secondary | ICD-10-CM | POA: Diagnosis not present

## 2015-09-14 DIAGNOSIS — F411 Generalized anxiety disorder: Secondary | ICD-10-CM | POA: Diagnosis not present

## 2015-09-20 DIAGNOSIS — Z0000 Encounter for general adult medical examination without abnormal findings: Secondary | ICD-10-CM | POA: Diagnosis not present

## 2015-09-28 DIAGNOSIS — F411 Generalized anxiety disorder: Secondary | ICD-10-CM | POA: Diagnosis not present

## 2015-10-27 DIAGNOSIS — F411 Generalized anxiety disorder: Secondary | ICD-10-CM | POA: Diagnosis not present

## 2015-11-03 DIAGNOSIS — H5213 Myopia, bilateral: Secondary | ICD-10-CM | POA: Diagnosis not present

## 2015-11-03 DIAGNOSIS — H52223 Regular astigmatism, bilateral: Secondary | ICD-10-CM | POA: Diagnosis not present

## 2015-11-10 DIAGNOSIS — F411 Generalized anxiety disorder: Secondary | ICD-10-CM | POA: Diagnosis not present

## 2015-11-14 DIAGNOSIS — J309 Allergic rhinitis, unspecified: Secondary | ICD-10-CM | POA: Diagnosis not present

## 2015-11-14 DIAGNOSIS — Z6827 Body mass index (BMI) 27.0-27.9, adult: Secondary | ICD-10-CM | POA: Diagnosis not present

## 2015-11-14 DIAGNOSIS — R490 Dysphonia: Secondary | ICD-10-CM | POA: Diagnosis not present

## 2015-11-14 MED FILL — AZELASTINE HCL 137 MCG SPRY: 0.1 | 30 days supply | Qty: 30 | Fill #0

## 2015-11-14 MED FILL — MONTELUKAST SOD 10 MG TAB: 10 | 90 days supply | Qty: 90 | Fill #0

## 2015-11-22 DIAGNOSIS — D2271 Melanocytic nevi of right lower limb, including hip: Secondary | ICD-10-CM | POA: Diagnosis not present

## 2015-11-22 DIAGNOSIS — D225 Melanocytic nevi of trunk: Secondary | ICD-10-CM | POA: Diagnosis not present

## 2015-11-22 DIAGNOSIS — Z85820 Personal history of malignant melanoma of skin: Secondary | ICD-10-CM | POA: Diagnosis not present

## 2015-11-22 DIAGNOSIS — D224 Melanocytic nevi of scalp and neck: Secondary | ICD-10-CM | POA: Diagnosis not present

## 2015-11-22 DIAGNOSIS — L718 Other rosacea: Secondary | ICD-10-CM | POA: Diagnosis not present

## 2015-11-22 DIAGNOSIS — D2262 Melanocytic nevi of left upper limb, including shoulder: Secondary | ICD-10-CM | POA: Diagnosis not present

## 2015-11-22 DIAGNOSIS — D2261 Melanocytic nevi of right upper limb, including shoulder: Secondary | ICD-10-CM | POA: Diagnosis not present

## 2015-11-22 DIAGNOSIS — D2272 Melanocytic nevi of left lower limb, including hip: Secondary | ICD-10-CM | POA: Diagnosis not present

## 2015-11-22 MED FILL — metroNIDAZOLE 0.75 % CREA: 0.75 | 20 days supply | Qty: 45 | Fill #0

## 2015-11-24 DIAGNOSIS — F411 Generalized anxiety disorder: Secondary | ICD-10-CM | POA: Diagnosis not present

## 2015-12-21 DIAGNOSIS — R634 Abnormal weight loss: Secondary | ICD-10-CM | POA: Diagnosis not present

## 2015-12-21 DIAGNOSIS — F411 Generalized anxiety disorder: Secondary | ICD-10-CM | POA: Diagnosis not present

## 2015-12-21 DIAGNOSIS — Z6826 Body mass index (BMI) 26.0-26.9, adult: Secondary | ICD-10-CM | POA: Diagnosis not present

## 2015-12-22 MED FILL — ESCITALOPRAM 5 MG TABLET: 5 | 30 days supply | Qty: 60 | Fill #0

## 2016-01-18 DIAGNOSIS — F411 Generalized anxiety disorder: Secondary | ICD-10-CM | POA: Diagnosis not present

## 2016-01-27 MED FILL — ESCITALOPRAM 5 MG TABLET: 5 | 30 days supply | Qty: 60 | Fill #1

## 2016-02-09 DIAGNOSIS — E663 Overweight: Secondary | ICD-10-CM | POA: Diagnosis not present

## 2016-02-09 DIAGNOSIS — F331 Major depressive disorder, recurrent, moderate: Secondary | ICD-10-CM | POA: Diagnosis not present

## 2016-02-09 DIAGNOSIS — Z6827 Body mass index (BMI) 27.0-27.9, adult: Secondary | ICD-10-CM | POA: Diagnosis not present

## 2016-02-09 DIAGNOSIS — F411 Generalized anxiety disorder: Secondary | ICD-10-CM | POA: Diagnosis not present

## 2016-02-20 DIAGNOSIS — F411 Generalized anxiety disorder: Secondary | ICD-10-CM | POA: Diagnosis not present

## 2016-02-20 MED FILL — ESCITALOPRAM 10 MG TABLET: 10 | 90 days supply | Qty: 90 | Fill #0

## 2016-03-27 DIAGNOSIS — F411 Generalized anxiety disorder: Secondary | ICD-10-CM | POA: Diagnosis not present

## 2016-05-01 DIAGNOSIS — F411 Generalized anxiety disorder: Secondary | ICD-10-CM | POA: Diagnosis not present

## 2016-05-11 DIAGNOSIS — F411 Generalized anxiety disorder: Secondary | ICD-10-CM | POA: Diagnosis not present

## 2016-05-11 DIAGNOSIS — Z6827 Body mass index (BMI) 27.0-27.9, adult: Secondary | ICD-10-CM | POA: Diagnosis not present

## 2016-05-11 DIAGNOSIS — J309 Allergic rhinitis, unspecified: Secondary | ICD-10-CM | POA: Diagnosis not present

## 2016-05-11 DIAGNOSIS — F331 Major depressive disorder, recurrent, moderate: Secondary | ICD-10-CM | POA: Diagnosis not present

## 2016-05-14 MED FILL — ESCITALOPRAM 10 MG TABLET: 10 | 90 days supply | Qty: 90 | Fill #1

## 2016-05-22 DIAGNOSIS — L718 Other rosacea: Secondary | ICD-10-CM | POA: Diagnosis not present

## 2016-05-22 DIAGNOSIS — Z85820 Personal history of malignant melanoma of skin: Secondary | ICD-10-CM | POA: Diagnosis not present

## 2016-05-22 DIAGNOSIS — D2262 Melanocytic nevi of left upper limb, including shoulder: Secondary | ICD-10-CM | POA: Diagnosis not present

## 2016-05-22 DIAGNOSIS — D2271 Melanocytic nevi of right lower limb, including hip: Secondary | ICD-10-CM | POA: Diagnosis not present

## 2016-05-22 DIAGNOSIS — D225 Melanocytic nevi of trunk: Secondary | ICD-10-CM | POA: Diagnosis not present

## 2016-05-22 DIAGNOSIS — D2272 Melanocytic nevi of left lower limb, including hip: Secondary | ICD-10-CM | POA: Diagnosis not present

## 2016-05-22 DIAGNOSIS — D2261 Melanocytic nevi of right upper limb, including shoulder: Secondary | ICD-10-CM | POA: Diagnosis not present

## 2016-05-22 DIAGNOSIS — D485 Neoplasm of uncertain behavior of skin: Secondary | ICD-10-CM | POA: Diagnosis not present

## 2016-06-05 MED FILL — AZITHROMYCIN 250 MG TABLET: 250 | 5 days supply | Qty: 6 | Fill #0

## 2016-06-05 MED FILL — CLINDAMYCIN PHOSP 1% LOTION: 1 | 30 days supply | Qty: 60 | Fill #0

## 2016-06-12 DIAGNOSIS — F411 Generalized anxiety disorder: Secondary | ICD-10-CM | POA: Diagnosis not present

## 2016-07-10 DIAGNOSIS — F411 Generalized anxiety disorder: Secondary | ICD-10-CM | POA: Diagnosis not present

## 2016-08-16 MED FILL — ESCITALOPRAM 10 MG TABLET: 10 | 90 days supply | Qty: 90 | Fill #0

## 2016-08-27 MED FILL — MONTELUKAST SOD 10 MG TAB: 10 | 90 days supply | Qty: 90 | Fill #1

## 2016-09-25 DIAGNOSIS — Z0000 Encounter for general adult medical examination without abnormal findings: Secondary | ICD-10-CM | POA: Diagnosis not present

## 2016-09-27 ENCOUNTER — Other Ambulatory Visit: Payer: Self-pay | Admitting: Family Medicine

## 2016-09-27 DIAGNOSIS — Z0000 Encounter for general adult medical examination without abnormal findings: Secondary | ICD-10-CM | POA: Diagnosis not present

## 2016-09-27 DIAGNOSIS — Z1231 Encounter for screening mammogram for malignant neoplasm of breast: Secondary | ICD-10-CM

## 2016-09-27 DIAGNOSIS — Z6827 Body mass index (BMI) 27.0-27.9, adult: Secondary | ICD-10-CM | POA: Diagnosis not present

## 2016-10-12 ENCOUNTER — Ambulatory Visit: Payer: 59

## 2016-11-01 ENCOUNTER — Ambulatory Visit: Payer: 59

## 2016-11-08 ENCOUNTER — Ambulatory Visit
Admission: RE | Admit: 2016-11-08 | Discharge: 2016-11-08 | Disposition: A | Discharge status: Home / Self Care | Payer: 59 | Source: Ambulatory Visit | Attending: Family Medicine | Admitting: Family Medicine

## 2016-11-08 DIAGNOSIS — Z1231 Encounter for screening mammogram for malignant neoplasm of breast: Secondary | ICD-10-CM

## 2016-11-09 MED FILL — ESCITALOPRAM 10 MG TABLET: 10 | 90 days supply | Qty: 90 | Fill #1

## 2016-11-27 DIAGNOSIS — D2262 Melanocytic nevi of left upper limb, including shoulder: Secondary | ICD-10-CM | POA: Diagnosis not present

## 2016-11-27 DIAGNOSIS — L718 Other rosacea: Secondary | ICD-10-CM | POA: Diagnosis not present

## 2016-11-27 DIAGNOSIS — Z85820 Personal history of malignant melanoma of skin: Secondary | ICD-10-CM | POA: Diagnosis not present

## 2016-11-27 DIAGNOSIS — L821 Other seborrheic keratosis: Secondary | ICD-10-CM | POA: Diagnosis not present

## 2016-11-27 DIAGNOSIS — D2261 Melanocytic nevi of right upper limb, including shoulder: Secondary | ICD-10-CM | POA: Diagnosis not present

## 2016-11-27 DIAGNOSIS — D225 Melanocytic nevi of trunk: Secondary | ICD-10-CM | POA: Diagnosis not present

## 2016-11-27 DIAGNOSIS — L814 Other melanin hyperpigmentation: Secondary | ICD-10-CM | POA: Diagnosis not present

## 2016-11-27 DIAGNOSIS — D2271 Melanocytic nevi of right lower limb, including hip: Secondary | ICD-10-CM | POA: Diagnosis not present

## 2016-11-27 DIAGNOSIS — D2272 Melanocytic nevi of left lower limb, including hip: Secondary | ICD-10-CM | POA: Diagnosis not present

## 2016-11-27 DIAGNOSIS — D485 Neoplasm of uncertain behavior of skin: Secondary | ICD-10-CM | POA: Diagnosis not present

## 2017-01-05 DIAGNOSIS — H5213 Myopia, bilateral: Secondary | ICD-10-CM | POA: Diagnosis not present

## 2017-01-05 DIAGNOSIS — H52223 Regular astigmatism, bilateral: Secondary | ICD-10-CM | POA: Diagnosis not present

## 2017-02-11 MED FILL — ESCITALOPRAM 10 MG TABLET: 10 | 90 days supply | Qty: 90 | Fill #2

## 2017-03-12 DIAGNOSIS — F411 Generalized anxiety disorder: Secondary | ICD-10-CM | POA: Diagnosis not present

## 2017-03-12 DIAGNOSIS — L719 Rosacea, unspecified: Secondary | ICD-10-CM | POA: Diagnosis not present

## 2017-03-12 DIAGNOSIS — J309 Allergic rhinitis, unspecified: Secondary | ICD-10-CM | POA: Diagnosis not present

## 2017-05-03 MED FILL — MONTELUKAST SOD 10 MG TAB: 10 | 90 days supply | Qty: 90 | Fill #0

## 2017-05-20 MED FILL — ESCITALOPRAM 10 MG TABLET: 10 | 90 days supply | Qty: 90 | Fill #0

## 2017-08-08 DIAGNOSIS — L718 Other rosacea: Secondary | ICD-10-CM | POA: Diagnosis not present

## 2017-08-08 DIAGNOSIS — D2261 Melanocytic nevi of right upper limb, including shoulder: Secondary | ICD-10-CM | POA: Diagnosis not present

## 2017-08-08 DIAGNOSIS — L821 Other seborrheic keratosis: Secondary | ICD-10-CM | POA: Diagnosis not present

## 2017-08-08 DIAGNOSIS — D2271 Melanocytic nevi of right lower limb, including hip: Secondary | ICD-10-CM | POA: Diagnosis not present

## 2017-08-08 DIAGNOSIS — Z85820 Personal history of malignant melanoma of skin: Secondary | ICD-10-CM | POA: Diagnosis not present

## 2017-08-08 DIAGNOSIS — D2272 Melanocytic nevi of left lower limb, including hip: Secondary | ICD-10-CM | POA: Diagnosis not present

## 2017-08-08 DIAGNOSIS — D225 Melanocytic nevi of trunk: Secondary | ICD-10-CM | POA: Diagnosis not present

## 2017-08-08 DIAGNOSIS — D2262 Melanocytic nevi of left upper limb, including shoulder: Secondary | ICD-10-CM | POA: Diagnosis not present

## 2017-08-08 MED FILL — PERMETHRIN 5% CREAM: 5 | 7 days supply | Qty: 60 | Fill #0

## 2017-08-08 MED FILL — DOXYCYCLINE HYCLATE 100 MG: 100 | 30 days supply | Qty: 60 | Fill #0 | Status: TO

## 2017-08-12 MED FILL — ESCITALOPRAM 10 MG TABLET: 10 | 90 days supply | Qty: 90 | Fill #1

## 2017-09-06 MED FILL — DOXYCYCLINE HYCLATE 100 MG: 100 | 30 days supply | Qty: 60 | Fill #0

## 2017-10-29 DIAGNOSIS — Z0000 Encounter for general adult medical examination without abnormal findings: Secondary | ICD-10-CM | POA: Diagnosis not present

## 2017-10-29 DIAGNOSIS — E559 Vitamin D deficiency, unspecified: Secondary | ICD-10-CM | POA: Diagnosis not present

## 2017-11-04 DIAGNOSIS — Z6829 Body mass index (BMI) 29.0-29.9, adult: Secondary | ICD-10-CM | POA: Diagnosis not present

## 2017-11-04 DIAGNOSIS — Z0000 Encounter for general adult medical examination without abnormal findings: Secondary | ICD-10-CM | POA: Diagnosis not present

## 2017-11-07 DIAGNOSIS — Z85820 Personal history of malignant melanoma of skin: Secondary | ICD-10-CM | POA: Diagnosis not present

## 2017-11-07 DIAGNOSIS — L718 Other rosacea: Secondary | ICD-10-CM | POA: Diagnosis not present

## 2017-11-07 MED FILL — SOD SULFACETAMIDE-SULFUR LO: 10-5 | 25 days supply | Qty: 25 | Fill #0

## 2017-11-11 MED FILL — ESCITALOPRAM 10 MG TABLET: 10 | 90 days supply | Qty: 90 | Fill #2

## 2017-11-11 MED FILL — DOXYCYCLINE HYCLATE 100 MG: 100 | 30 days supply | Qty: 60 | Fill #0

## 2017-12-30 DIAGNOSIS — H5213 Myopia, bilateral: Secondary | ICD-10-CM | POA: Diagnosis not present

## 2017-12-30 DIAGNOSIS — H52223 Regular astigmatism, bilateral: Secondary | ICD-10-CM | POA: Diagnosis not present

## 2018-01-10 ENCOUNTER — Ambulatory Visit: Payer: 59 | Admitting: Sports Medicine

## 2018-01-10 VITALS — BP 118/82 | Ht 69.0 in | Wt 186.0 lb

## 2018-01-10 DIAGNOSIS — G8929 Other chronic pain: Secondary | ICD-10-CM

## 2018-01-10 DIAGNOSIS — M25562 Pain in left knee: Secondary | ICD-10-CM

## 2018-01-10 DIAGNOSIS — M25561 Pain in right knee: Secondary | ICD-10-CM | POA: Diagnosis not present

## 2018-01-10 NOTE — Progress Notes (Signed)
   Subjective:    Patient ID: [name], female    DOB: [birth year], 43 y.o.   MRN: [id number]  HPI chief complaint: Bilateral knee pain  Very pleasant 43 year old female comes in today complaining of several weeks of bilateral knee pain.  No specific injury that she can recall but a gradual onset of pain that began when she started training for a 5K.  She describes an achy discomfort diffuse throughout the knee.  Symptoms are most noticeable the day after a run or with prolonged sitting.  She is training for an 8 mile trail race in a couple of weeks.  She denies problems with her knees in the past.  No feelings of instability.  No locking, catching.  No swelling.  No prior knee surgeries.  She has tried icing with some benefit.  400 mg of ibuprofen also seems to help some.  Past medical history reviewed Medications reviewed Allergies reviewed    Review of Systems    As above  Objective:   Physical Exam  Well-developed, well-nourished.  No acute distress.  Awake alert and oriented x3.  Vital signs reviewed  Examination of both knees shows full range of motion.  No effusion.  2-3+ patellofemoral crepitus bilaterally.  VMO weakness bilaterally.  No tenderness to palpation along medial or lateral joint lines.  Knees are stable to ligamentous exam.  She does have pain with Thessaly's testing on the left.  Some tethering of the left patella laterally.  Good patellar mobility on the right.  Neurovascular intact distally.  Evaluation of her running gait shows externally rotated right foot.  Hip abductor strength is good.      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  I am going to give her some VMO strengthening exercises to start doing daily.  She is okay to run her 8 mile trail race in a  couple of weeks but I would like for her to back down on her running just a bit after that.  I think she should try to stick with relatively flat surfaces as well.  She has been doing quite a bit of lunges and squatting as part of her weekly exercise routine and I have recommended that she avoid those for the time being.  She may continue with over-the-counter ibuprofen and icing as needed.  She will want to start some line drills to help correct her external rotation on the right foot.  Follow-up with me in 6 weeks for reevaluation.  We will base further work-up and treatment on x-ray findings if symptoms persist or worsen.

## 2018-01-10 NOTE — Patient Instructions (Signed)
What is chondromalacia patellae? Chondromalacia patellae, also known as "runner's knee," is a condition where the cartilage on the undersurface of the patella (kneecap) deteriorates and softens. This condition is common among young, athletic individuals, but may also occur in older adults who have arthritis of the knee.  Chondromalacia is often seen as an overuse injury in sports, and sometimes taking a few days off from training can produce good results. In other cases, improper knee alignment is the cause and simply resting doesn't provide relief. The symptoms of runner's knee are knee pain and grinding sensations, but many people who have it never seek medical treatment.  What causes chondromalacia patellae? Your kneecap normally resides over the front of your knee joint. When you bend your knee, the backside of your kneecap glides over the cartilage of your femur, or thigh bone, at the knee. Tendons and ligaments attach your kneecap to your shinbone and thigh muscle. When any of these components fails to move properly, it can cause your kneecap to rub up against your thigh bone. This abnormal rubbing can lead to deterioration in the patella, resulting in chondromalacia patellae, or runner's knee.  Improper kneecap movement may result from: poor alignment due to a congenital condition weak hamstrings and quadriceps (the muscles in the back and front of your thighs, respectively) muscle imbalance between the adductors and abductors (the muscles on the outside and inside of your thighs) repeated stress to your knee joints, such as from running, skiing, or jumping a direct blow or trauma to your kneecap  Who is at risk for chondromalacia patellae? There are a variety of factors that may increase your risk for developing chondromalacia patellae.  Age Adolescents and young adults are at high risk for this condition. During growth spurts, the muscles and bones develop rapidly, which may contribute to  short-term muscle imbalances.  Sex Females are more likely than males to develop runner's knee, as they typically possess less muscle mass than males. This can cause abnormal knee positioning, as well as more lateral (side) pressure on the kneecap.  Flat feet Having flat feet may place more stress on the knee joints than having higher arches would.  Previous injury A prior injury to the kneecap, such as a dislocation, can increase your risk of developing runner's knee.  High activity level If you have a high activity level or engage in frequent exercises that place pressure on your knee joints, this can increase the risk for knee problems.  Arthritis Runner's knee can also be a symptom of arthritis, a condition causing inflammation to the joint and tissue. Inflammation can prevent the kneecap from functioning properly.   What are the symptoms of chondromalacia patellae? Chondromalacia patellae will typically present as pain in the knee region, known as patellofemoral pain. You may feel sensations of grinding or cracking when bending or extending your knee. Pain may worsen after sitting for a prolonged period of time or during activities that apply extreme pressure to your knees, such as standing for an extended period or exercising.  Talk to your doctor if you have knee pain that doesn't improve within a few days.  Diagnosing and grading chondromalacia patellae Your doctor will look for areas of swelling or tenderness in your knee. They may also look at how your kneecap aligns with your thigh bone. A misalignment can be an indicator of chondromalacia patellae. Your doctor may also apply resistive pressure to your extended kneecap to determine the tenderness and severity.  Afterward, your doctor  may request any of the following tests to aid in diagnosis and grading:  X-rays to show bone damage or signs of misalignment or arthritis magnetic resonance imaging (MRI) to view cartilage wear  and tear arthroscopic exam, a minimally invasive procedure to visualize the inside of the knee that involves inserting an endoscope and camera into the knee joint  Grading  There are four grades, ranging from grade 1 to 4, that designate the severity of runner's knee. Grade 1 is least severe, while grade 4 indicates the greatest severity.  Grade 1 severity indicates softening of the cartilage in the knee area. Grade 2 indicates a softening of the cartilage along with abnormal surface characteristics. This usually marks the beginning of tissue erosion. Grade 3 shows thinning of cartilage with active deterioration of the tissue. Grade 4, the most severe grade, indicates exposure of the bone with a significant portion of cartilage deteriorated. Bone exposure means bone-to-bone rubbing is likely occurring in the knee.  Treatment options for chondromalacia patellae The goal of treatment is to reduce the pressure on your kneecap and joint. Resting, stabilizing, and icing the joint may be the first line of treatment. The cartilage damage resulting in runner's knee can often repair itself with rest.  Your doctor may prescribe several weeks of anti-inflammatory medication, such as ibuprofen, to reduce inflammation around the joint. If swelling, tenderness, and pain persist, the following treatment options may be explored.  Physical therapy  Physical therapy focusing on strengthening the quadriceps, hamstrings, adductors, and abductors can help improve your muscle strength and balance. Muscle balance will help prevent knee misalignment.  Typically recommended are non-weight-bearing exercises, such as swimming or riding a stationary bike. Additionally, isometric exercises that involve tightening and releasing your muscles can help to maintain muscle mass.  Surgery  Arthroscopic surgery may be necessary to examine the joint and determine whether there's misalignment of the knee. This surgery involves  inserting a camera into your joint through a tiny incision. A surgical procedure may fix the problem. One common procedure is a lateral release. This operation involves cutting some of your ligaments to release tension and allow for more movement.  Other surgical options may involve smoothing the back of the kneecap, implanting a cartilage graft, or relocating the insertion of the thigh muscle.  Tips to prevent chondromalacia patellae You can help reduce your risk of developing runner's knee by following these recommendations:  Avoid repeated stress to your kneecaps. Wear kneepads if you have to spend time on your knees. Create muscle balance by strengthening your quadriceps, hamstrings, abductors, and adductors. Wear shoe inserts that correct flat feet by increasing your arch. This will decrease the amount of pressure placed on your knees and may realign the kneecap.  Finally, excess body weight may stress your knees. Maintaining a healthy body weight can help take pressure off the knees and other joints. You can take steps to lose weight by reducing your sugar and fat intake, eating plenty of vegetables, fruits, and whole grains, and exercising for at least 30 minutes a day, five times a week.

## 2018-01-15 MED FILL — DOXYCYCLINE HYCLATE 100 MG: 100 | 30 days supply | Qty: 60 | Fill #1

## 2018-01-20 ENCOUNTER — Ambulatory Visit: Payer: 59 | Admitting: Sports Medicine

## 2018-01-21 ENCOUNTER — Ambulatory Visit: Payer: 59 | Admitting: Sports Medicine

## 2018-01-21 VITALS — BP 104/78 | Ht 69.0 in | Wt 186.0 lb

## 2018-01-21 DIAGNOSIS — M25572 Pain in left ankle and joints of left foot: Secondary | ICD-10-CM | POA: Diagnosis not present

## 2018-01-21 DIAGNOSIS — S93492A Sprain of other ligament of left ankle, initial encounter: Secondary | ICD-10-CM

## 2018-01-21 MED ORDER — MELOXICAM 15 MG PO TABS: ORAL_TABLET | ORAL | 0 refills | Status: DC

## 2018-01-21 MED FILL — MELOXICAM 15 MG TABLET: 15 | 40 days supply | Qty: 40 | Fill #0

## 2018-01-22 ENCOUNTER — Encounter: Payer: Self-pay | Admitting: Sports Medicine

## 2018-01-22 NOTE — Progress Notes (Signed)
   Subjective:    Patient ID: [name], female    DOB: 1975/07/10, 43 y.o.   MRN: [id number]  HPI chief complaint: Left ankle pain  Patient comes in today complaining of left ankle pain after suffering an inversion injury while running a trail race this past weekend.  She rolled her ankle and felt a pop.  Immediate pain but was able to continue running.  She developed swelling and ecchymosis shortly thereafter.  She has been wearing a compression sock and using ice, elevation, and ibuprofen as needed.  As a result her symptoms are improving.  Her pain is along the lateral ankle.  She denies pain in the medial ankle.  She denies any pain in her foot.  No significant ankle injuries in the past.  No pain in her knee.   Review of Systems    Well-developed, well-nourished.  No acute distress.  Awake alert and oriented x3.  Vital signs reviewed Objective:   Physical Exam  Left ankle: There is diffuse swelling and ecchymosis along both medial and lateral ankle.  There is no tenderness to palpation along the medial malleolus nor over the distal fibula.  No tenderness to palpation at the navicular nor at the base of the fifth metatarsal.  There is limited range of motion secondary to her swelling.  Anterior drawer and talar tilt testing are difficult to assess due to swelling.  Good strength.  Good pulses.  She is able to bear weight and walk with a noticeable limp.  MSK ultrasound evaluation of the left ankle shows no obvious fracture through the distal fibula or medial malleolus      Assessment & Plan:   Left ankle pain and swelling secondary to ankle sprain  Patient will continue with her compression sock or with a med spec brace with activity.  Meloxicam 15 mg daily with food for 5 days then as needed.  She is cautioned about GI upset.  She will start a home exercise program consisting of range of motion and strengthening exercises.  Follow-up with me in 3 to 4 weeks for reevaluation.  If  she is doing well we will add proprioception exercises at that time.  She may start to resume activity as tolerated but I have cautioned her about running on uneven surfaces.  Based on her clinical evaluation and her ultrasound findings I do not believe we need to proceed with x-rays at this time.  However, if the patient's symptoms are persisting or worsening then she is instructed to notify my office so that we can order a 3 view x-ray of the left ankle.

## 2018-01-28 MED FILL — SOD SULFACETAMIDE-SULFUR LO: 10-5 | 25 days supply | Qty: 25 | Fill #1

## 2018-02-11 ENCOUNTER — Ambulatory Visit: Payer: 59 | Admitting: Sports Medicine

## 2018-02-11 ENCOUNTER — Ambulatory Visit
Admission: RE | Admit: 2018-02-11 | Discharge: 2018-02-11 | Disposition: A | Discharge status: Home / Self Care | Payer: 59 | Source: Ambulatory Visit | Attending: Sports Medicine | Admitting: Sports Medicine

## 2018-02-11 VITALS — BP 122/77 | Ht 69.0 in | Wt 190.0 lb

## 2018-02-11 DIAGNOSIS — M25561 Pain in right knee: Principal | ICD-10-CM

## 2018-02-11 DIAGNOSIS — S93402D Sprain of unspecified ligament of left ankle, subsequent encounter: Secondary | ICD-10-CM

## 2018-02-11 DIAGNOSIS — M25562 Pain in left knee: Principal | ICD-10-CM

## 2018-02-11 DIAGNOSIS — M1712 Unilateral primary osteoarthritis, left knee: Secondary | ICD-10-CM | POA: Diagnosis not present

## 2018-02-11 DIAGNOSIS — G8929 Other chronic pain: Secondary | ICD-10-CM

## 2018-02-11 MED FILL — ESCITALOPRAM 10 MG TABLET: 10 | 90 days supply | Qty: 90 | Fill #3

## 2018-02-11 NOTE — Progress Notes (Signed)
   HPI  CC: Left ankle pain  [name] is a 43 year old female who presents for follow-up of left ankle pain.  She states that since she was last seen her ankle pain is completely resolved.  She has been doing the home range of motion strength exercises given to her last appointment.  She states she is now up to running 6 miles a week again.  She has not taken any medications for pain.  She states she sometimes feels the pain slightly at the end of her runs.  She denies any numbness and tingling in her foot.  She denies any weakness of the foot.  She has had no new trauma to the area.  She was also previously seen for her knees in early January.  At that time, she was told to stop doing deep squats and lunges.  She states this has completely resolved her pain as well.  She had some x-rays that were performed today, which showed mild spurring at the superior edge of the patella, but no degenerative changes noted.  See HPI and/or previous note for associated ROS.  Objective: BP 122/77   Ht 5\' 9"  (1.753 m)   Wt 190 lb (86.2 kg)   BMI 28.06 kg/m  Gen: Right-Hand Dominant. NAD, well groomed, a/o x3, normal affect.  CV: Well-perfused. Warm.  Resp: Non-labored.  Neuro: Sensation intact throughout. No gross coordination deficits.  Gait: Nonpathologic posture, unremarkable stride without signs of limp or balance issues.  Left ankle exam: No erythema or warmth noted.  Mild swelling along the lateral ankle.  No tenderness palpation on exam.  Full range of motion dorsiflexion, plantarflexion, eversion, inversion.  Strength out of 5 throughout testing.  Negative anterior drawer test.  Bilateral knee exam: No erythema, warmth, swelling noted.  No tenderness palpation on exam.  Full range of motion in knee flexion knee extension.  Strength 5/5 throughout testing.  Negative ligamentous testing.  Assessment and plan: 1. Left-sided ankle sprain, involving the ATFL. 2.  Bilateral knee pain, with some spurring  noted on bilateral superior edge of the patella.  This is likely secondary to repetitive deep squats.  We discussed treatment options at today's visit.  Her ankle pain is resolved at this point.  We will start her on some proprioception exercises to prevent future sprains as she is recovering.  I advised to continue to do her ankle strengthening exercises while she is working on this.  She can see [place] as needed for this issue.  For her knees, I recommend she continue to avoid deep squats and lunges, as this likely is what is brought on some of the pain in her knees.   Lewanda Rife, MD [facility] Fellow 02/11/2018 2:04 PM   Patient seen and evaluated with the sports medicine fellow.  I agree with the above plan of care.  Patient is doing well from the standpoint of her recent ankle sprain.  Proceed with treatment as above.  Follow-up as needed.

## 2018-02-11 NOTE — Patient Instructions (Signed)
Thank you for coming see [place] today in clinic.  You were seen today for your ankle and your knees.  For your ankle, we have given you some proprioception exercises to work on.  He can continue to do your other exercises for the next 2 to 3 weeks.  You can slowly return back to running.  For your knees, we recommend avoiding deep squats and deep lunges.  If the pain returns your knee, please call to be reevaluated.  Otherwise, we will see you as needed.

## 2018-02-18 DIAGNOSIS — L718 Other rosacea: Secondary | ICD-10-CM | POA: Diagnosis not present

## 2018-02-18 DIAGNOSIS — Z85820 Personal history of malignant melanoma of skin: Secondary | ICD-10-CM | POA: Diagnosis not present

## 2018-02-24 ENCOUNTER — Ambulatory Visit: Payer: 59 | Admitting: Sports Medicine

## 2018-03-03 DIAGNOSIS — F411 Generalized anxiety disorder: Secondary | ICD-10-CM | POA: Diagnosis not present

## 2018-03-03 DIAGNOSIS — Z6829 Body mass index (BMI) 29.0-29.9, adult: Secondary | ICD-10-CM | POA: Diagnosis not present

## 2018-03-03 DIAGNOSIS — L719 Rosacea, unspecified: Secondary | ICD-10-CM | POA: Diagnosis not present

## 2018-03-03 DIAGNOSIS — Z30432 Encounter for removal of intrauterine contraceptive device: Secondary | ICD-10-CM | POA: Diagnosis not present

## 2018-03-03 DIAGNOSIS — E663 Overweight: Secondary | ICD-10-CM | POA: Diagnosis not present

## 2018-03-03 DIAGNOSIS — B373 Candidiasis of vulva and vagina: Secondary | ICD-10-CM | POA: Diagnosis not present

## 2018-03-03 DIAGNOSIS — N760 Acute vaginitis: Secondary | ICD-10-CM | POA: Diagnosis not present

## 2018-03-03 MED FILL — FLUCONAZOLE 150 MG TABS: 150 | 8 days supply | Qty: 2 | Fill #0

## 2018-04-22 MED FILL — SOD SULFACETAMIDE-SULFUR LO: 10-5 | 25 days supply | Qty: 25 | Fill #0

## 2018-04-22 MED FILL — ESCITALOPRAM 10 MG TABLET: 10 | 90 days supply | Qty: 90 | Fill #0

## 2018-08-05 MED FILL — ESCITALOPRAM 10 MG TABLET: 10 | 90 days supply | Qty: 90 | Fill #1

## 2018-08-19 DIAGNOSIS — D2272 Melanocytic nevi of left lower limb, including hip: Secondary | ICD-10-CM | POA: Diagnosis not present

## 2018-08-19 DIAGNOSIS — D2271 Melanocytic nevi of right lower limb, including hip: Secondary | ICD-10-CM | POA: Diagnosis not present

## 2018-08-19 DIAGNOSIS — D1801 Hemangioma of skin and subcutaneous tissue: Secondary | ICD-10-CM | POA: Diagnosis not present

## 2018-08-19 DIAGNOSIS — Z85820 Personal history of malignant melanoma of skin: Secondary | ICD-10-CM | POA: Diagnosis not present

## 2018-08-19 DIAGNOSIS — D225 Melanocytic nevi of trunk: Secondary | ICD-10-CM | POA: Diagnosis not present

## 2018-08-19 DIAGNOSIS — D2261 Melanocytic nevi of right upper limb, including shoulder: Secondary | ICD-10-CM | POA: Diagnosis not present

## 2018-08-19 DIAGNOSIS — L718 Other rosacea: Secondary | ICD-10-CM | POA: Diagnosis not present

## 2018-08-19 DIAGNOSIS — D2262 Melanocytic nevi of left upper limb, including shoulder: Secondary | ICD-10-CM | POA: Diagnosis not present

## 2018-11-07 MED FILL — ESCITALOPRAM 10 MG TABLET: 10 | 90 days supply | Qty: 90 | Fill #2

## 2018-11-10 MED FILL — SOD SULFACETAMIDE-SULFUR LO: 10-5 | 25 days supply | Qty: 25 | Fill #0

## 2019-02-09 MED FILL — ESCITALOPRAM 10 MG TABLET: 10 | 90 days supply | Qty: 90 | Fill #3

## 2019-05-08 DIAGNOSIS — L719 Rosacea, unspecified: Secondary | ICD-10-CM | POA: Diagnosis not present

## 2019-05-08 DIAGNOSIS — F411 Generalized anxiety disorder: Secondary | ICD-10-CM | POA: Diagnosis not present

## 2019-05-08 DIAGNOSIS — F331 Major depressive disorder, recurrent, moderate: Secondary | ICD-10-CM | POA: Diagnosis not present

## 2019-05-11 MED FILL — ESCITALOPRAM 10 MG TABLET: 10 | 90 days supply | Qty: 90 | Fill #0

## 2019-05-11 MED FILL — SULFACETAMIDE SOD 10% TOP S: 10 | 30 days supply | Qty: 118 | Fill #0

## 2019-06-05 DIAGNOSIS — H5213 Myopia, bilateral: Secondary | ICD-10-CM | POA: Diagnosis not present

## 2019-06-29 DIAGNOSIS — Z0000 Encounter for general adult medical examination without abnormal findings: Secondary | ICD-10-CM | POA: Diagnosis not present

## 2019-06-29 DIAGNOSIS — Z0184 Encounter for antibody response examination: Secondary | ICD-10-CM | POA: Diagnosis not present

## 2019-07-01 DIAGNOSIS — Z01419 Encounter for gynecological examination (general) (routine) without abnormal findings: Secondary | ICD-10-CM | POA: Diagnosis not present

## 2019-07-01 DIAGNOSIS — Z113 Encounter for screening for infections with a predominantly sexual mode of transmission: Secondary | ICD-10-CM | POA: Diagnosis not present

## 2019-07-01 DIAGNOSIS — Z23 Encounter for immunization: Secondary | ICD-10-CM | POA: Diagnosis not present

## 2019-07-01 DIAGNOSIS — Z118 Encounter for screening for other infectious and parasitic diseases: Secondary | ICD-10-CM | POA: Diagnosis not present

## 2019-07-01 DIAGNOSIS — E663 Overweight: Secondary | ICD-10-CM | POA: Diagnosis not present

## 2019-07-01 DIAGNOSIS — Z1151 Encounter for screening for human papillomavirus (HPV): Secondary | ICD-10-CM | POA: Diagnosis not present

## 2019-07-01 DIAGNOSIS — Z0000 Encounter for general adult medical examination without abnormal findings: Secondary | ICD-10-CM | POA: Diagnosis not present

## 2019-07-02 ENCOUNTER — Other Ambulatory Visit: Payer: Self-pay | Admitting: Family Medicine

## 2019-07-02 DIAGNOSIS — Z1231 Encounter for screening mammogram for malignant neoplasm of breast: Secondary | ICD-10-CM

## 2019-07-09 ENCOUNTER — Ambulatory Visit
Admission: RE | Admit: 2019-07-09 | Discharge: 2019-07-09 | Disposition: A | Discharge status: Home / Self Care | Payer: 59 | Source: Ambulatory Visit | Attending: Family Medicine | Admitting: Family Medicine

## 2019-07-09 ENCOUNTER — Other Ambulatory Visit: Payer: Self-pay

## 2019-07-09 DIAGNOSIS — Z1231 Encounter for screening mammogram for malignant neoplasm of breast: Secondary | ICD-10-CM

## 2019-07-14 ENCOUNTER — Other Ambulatory Visit: Payer: Self-pay | Admitting: Family Medicine

## 2019-07-14 DIAGNOSIS — R928 Other abnormal and inconclusive findings on diagnostic imaging of breast: Secondary | ICD-10-CM

## 2019-07-27 ENCOUNTER — Ambulatory Visit: Payer: 59

## 2019-07-27 ENCOUNTER — Other Ambulatory Visit: Payer: Self-pay

## 2019-07-27 ENCOUNTER — Ambulatory Visit
Admission: RE | Admit: 2019-07-27 | Discharge: 2019-07-27 | Disposition: A | Discharge status: Home / Self Care | Payer: 59 | Source: Ambulatory Visit | Attending: Family Medicine | Admitting: Family Medicine

## 2019-07-27 DIAGNOSIS — R928 Other abnormal and inconclusive findings on diagnostic imaging of breast: Secondary | ICD-10-CM

## 2019-07-27 DIAGNOSIS — R922 Inconclusive mammogram: Secondary | ICD-10-CM | POA: Diagnosis not present

## 2019-08-07 DIAGNOSIS — R519 Headache, unspecified: Secondary | ICD-10-CM | POA: Diagnosis not present

## 2019-08-07 DIAGNOSIS — T43225A Adverse effect of selective serotonin reuptake inhibitors, initial encounter: Secondary | ICD-10-CM | POA: Diagnosis not present

## 2019-08-20 DIAGNOSIS — F411 Generalized anxiety disorder: Secondary | ICD-10-CM | POA: Diagnosis not present

## 2019-08-20 DIAGNOSIS — D2272 Melanocytic nevi of left lower limb, including hip: Secondary | ICD-10-CM | POA: Diagnosis not present

## 2019-08-20 DIAGNOSIS — E663 Overweight: Secondary | ICD-10-CM | POA: Diagnosis not present

## 2019-08-20 DIAGNOSIS — D2271 Melanocytic nevi of right lower limb, including hip: Secondary | ICD-10-CM | POA: Diagnosis not present

## 2019-08-20 DIAGNOSIS — G4700 Insomnia, unspecified: Secondary | ICD-10-CM | POA: Diagnosis not present

## 2019-08-20 DIAGNOSIS — D2261 Melanocytic nevi of right upper limb, including shoulder: Secondary | ICD-10-CM | POA: Diagnosis not present

## 2019-08-20 DIAGNOSIS — L820 Inflamed seborrheic keratosis: Secondary | ICD-10-CM | POA: Diagnosis not present

## 2019-08-20 DIAGNOSIS — D225 Melanocytic nevi of trunk: Secondary | ICD-10-CM | POA: Diagnosis not present

## 2019-08-20 DIAGNOSIS — D485 Neoplasm of uncertain behavior of skin: Secondary | ICD-10-CM | POA: Diagnosis not present

## 2019-08-20 DIAGNOSIS — F331 Major depressive disorder, recurrent, moderate: Secondary | ICD-10-CM | POA: Diagnosis not present

## 2019-08-20 DIAGNOSIS — D2262 Melanocytic nevi of left upper limb, including shoulder: Secondary | ICD-10-CM | POA: Diagnosis not present

## 2019-08-20 DIAGNOSIS — Z85820 Personal history of malignant melanoma of skin: Secondary | ICD-10-CM | POA: Diagnosis not present

## 2019-08-20 DIAGNOSIS — L718 Other rosacea: Secondary | ICD-10-CM | POA: Diagnosis not present

## 2019-08-20 MED FILL — SOD SULFACETAMIDE-SULFUR LO: 10-5 | 20 days supply | Qty: 25 | Fill #0

## 2019-12-02 ENCOUNTER — Other Ambulatory Visit (HOSPITAL_COMMUNITY): Payer: Self-pay | Admitting: Family Medicine

## 2019-12-02 DIAGNOSIS — G4700 Insomnia, unspecified: Secondary | ICD-10-CM | POA: Diagnosis not present

## 2019-12-02 DIAGNOSIS — R030 Elevated blood-pressure reading, without diagnosis of hypertension: Secondary | ICD-10-CM | POA: Diagnosis not present

## 2019-12-02 DIAGNOSIS — F411 Generalized anxiety disorder: Secondary | ICD-10-CM | POA: Diagnosis not present

## 2019-12-02 DIAGNOSIS — F331 Major depressive disorder, recurrent, moderate: Secondary | ICD-10-CM | POA: Diagnosis not present

## 2019-12-02 DIAGNOSIS — E663 Overweight: Secondary | ICD-10-CM | POA: Diagnosis not present

## 2019-12-02 MED FILL — ALPRAZolam 0.25 MG TABS: 0.25 | 8 days supply | Qty: 45 | Fill #0

## 2019-12-02 MED FILL — buPROPion HCL ER (XL) 150 M: 150 | 90 days supply | Qty: 90 | Fill #0

## 2020-01-06 DIAGNOSIS — Z23 Encounter for immunization: Secondary | ICD-10-CM | POA: Diagnosis not present

## 2020-02-04 ENCOUNTER — Other Ambulatory Visit (HOSPITAL_COMMUNITY): Payer: Self-pay | Admitting: Family Medicine

## 2020-02-04 DIAGNOSIS — E559 Vitamin D deficiency, unspecified: Secondary | ICD-10-CM | POA: Diagnosis not present

## 2020-02-04 DIAGNOSIS — G4700 Insomnia, unspecified: Secondary | ICD-10-CM | POA: Diagnosis not present

## 2020-02-04 DIAGNOSIS — F331 Major depressive disorder, recurrent, moderate: Secondary | ICD-10-CM | POA: Diagnosis not present

## 2020-02-04 DIAGNOSIS — F411 Generalized anxiety disorder: Secondary | ICD-10-CM | POA: Diagnosis not present

## 2020-02-04 MED FILL — SERTRALINE HCL 25 MG TABLET: 25 | 90 days supply | Qty: 90 | Fill #0

## 2020-02-29 MED FILL — buPROPion HCL ER (XL) 150 M: 150 | 90 days supply | Qty: 90 | Fill #1

## 2020-03-08 DIAGNOSIS — F411 Generalized anxiety disorder: Secondary | ICD-10-CM | POA: Diagnosis not present

## 2020-03-08 DIAGNOSIS — G4700 Insomnia, unspecified: Secondary | ICD-10-CM | POA: Diagnosis not present

## 2020-03-08 DIAGNOSIS — F331 Major depressive disorder, recurrent, moderate: Secondary | ICD-10-CM | POA: Diagnosis not present

## 2020-04-05 ENCOUNTER — Other Ambulatory Visit (HOSPITAL_COMMUNITY): Payer: Self-pay | Admitting: Family Medicine

## 2020-04-05 DIAGNOSIS — F331 Major depressive disorder, recurrent, moderate: Secondary | ICD-10-CM | POA: Diagnosis not present

## 2020-04-05 DIAGNOSIS — F411 Generalized anxiety disorder: Secondary | ICD-10-CM | POA: Diagnosis not present

## 2020-04-05 DIAGNOSIS — J309 Allergic rhinitis, unspecified: Secondary | ICD-10-CM | POA: Diagnosis not present

## 2020-04-05 DIAGNOSIS — L719 Rosacea, unspecified: Secondary | ICD-10-CM | POA: Diagnosis not present

## 2020-04-05 MED FILL — FLUTICASONE PROP 50 MCG SPR: 50 | 30 days supply | Qty: 16 | Fill #0

## 2020-04-05 MED FILL — SERTRALINE HCL 50 MG TABLET: 50 | 90 days supply | Qty: 90 | Fill #0

## 2020-04-05 MED FILL — ALBUTEROL SULFATE HFA 108 (: 108 (90 BAS | 34 days supply | Qty: 17 | Fill #0

## 2020-05-27 ENCOUNTER — Other Ambulatory Visit (HOSPITAL_COMMUNITY): Payer: Self-pay

## 2020-05-27 MED FILL — Alprazolam Tab 0.25 MG: ORAL | 8 days supply | Qty: 45 | Fill #0 | Status: AC

## 2020-05-27 MED FILL — Bupropion HCl Tab ER 24HR 150 MG: ORAL | 90 days supply | Qty: 90 | Fill #0 | Status: AC

## 2020-06-08 ENCOUNTER — Other Ambulatory Visit (HOSPITAL_COMMUNITY): Payer: Self-pay

## 2020-06-08 MED ORDER — AZITHROMYCIN 250 MG PO TABS
ORAL_TABLET | ORAL | 1 refills | Status: DC
  Filled 2020-06-08: qty 6, 5d supply, fill #0

## 2020-07-04 ENCOUNTER — Other Ambulatory Visit (HOSPITAL_COMMUNITY): Payer: Self-pay

## 2020-07-04 MED FILL — Sertraline HCl Tab 50 MG: ORAL | 90 days supply | Qty: 90 | Fill #0 | Status: AC

## 2020-07-05 DIAGNOSIS — Z1211 Encounter for screening for malignant neoplasm of colon: Secondary | ICD-10-CM | POA: Diagnosis not present

## 2020-07-05 DIAGNOSIS — Z01419 Encounter for gynecological examination (general) (routine) without abnormal findings: Secondary | ICD-10-CM | POA: Diagnosis not present

## 2020-07-05 DIAGNOSIS — Z113 Encounter for screening for infections with a predominantly sexual mode of transmission: Secondary | ICD-10-CM | POA: Diagnosis not present

## 2020-07-05 DIAGNOSIS — Z0000 Encounter for general adult medical examination without abnormal findings: Secondary | ICD-10-CM | POA: Diagnosis not present

## 2020-07-05 DIAGNOSIS — Z1151 Encounter for screening for human papillomavirus (HPV): Secondary | ICD-10-CM | POA: Diagnosis not present

## 2020-07-05 DIAGNOSIS — Z118 Encounter for screening for other infectious and parasitic diseases: Secondary | ICD-10-CM | POA: Diagnosis not present

## 2020-07-06 ENCOUNTER — Other Ambulatory Visit (HOSPITAL_COMMUNITY): Payer: Self-pay

## 2020-07-07 ENCOUNTER — Other Ambulatory Visit (HOSPITAL_COMMUNITY): Payer: Self-pay

## 2020-07-07 LAB — RESULTS CONSOLE HPV: CHL HPV: NEGATIVE

## 2020-07-07 LAB — HM PAP SMEAR
Chlamydia, Swab/Urine, PCR: NEGATIVE
HPV, high-risk: NEGATIVE

## 2020-07-07 MED ORDER — CARESTART COVID-19 HOME TEST VI KIT
PACK | 0 refills | Status: DC
Start: 2020-07-06 — End: 2021-10-24
  Filled 2020-07-07: qty 4, 4d supply, fill #0

## 2020-08-23 DIAGNOSIS — D2271 Melanocytic nevi of right lower limb, including hip: Secondary | ICD-10-CM | POA: Diagnosis not present

## 2020-08-23 DIAGNOSIS — Z85820 Personal history of malignant melanoma of skin: Secondary | ICD-10-CM | POA: Diagnosis not present

## 2020-08-23 DIAGNOSIS — D2261 Melanocytic nevi of right upper limb, including shoulder: Secondary | ICD-10-CM | POA: Diagnosis not present

## 2020-08-23 DIAGNOSIS — D2262 Melanocytic nevi of left upper limb, including shoulder: Secondary | ICD-10-CM | POA: Diagnosis not present

## 2020-08-23 DIAGNOSIS — L814 Other melanin hyperpigmentation: Secondary | ICD-10-CM | POA: Diagnosis not present

## 2020-08-23 DIAGNOSIS — D2272 Melanocytic nevi of left lower limb, including hip: Secondary | ICD-10-CM | POA: Diagnosis not present

## 2020-08-23 DIAGNOSIS — D225 Melanocytic nevi of trunk: Secondary | ICD-10-CM | POA: Diagnosis not present

## 2020-08-23 DIAGNOSIS — D2239 Melanocytic nevi of other parts of face: Secondary | ICD-10-CM | POA: Diagnosis not present

## 2020-08-23 DIAGNOSIS — L821 Other seborrheic keratosis: Secondary | ICD-10-CM | POA: Diagnosis not present

## 2020-08-30 ENCOUNTER — Other Ambulatory Visit (HOSPITAL_COMMUNITY): Payer: Self-pay

## 2020-08-30 MED FILL — Bupropion HCl Tab ER 24HR 150 MG: ORAL | 90 days supply | Qty: 90 | Fill #1 | Status: AC

## 2020-09-06 ENCOUNTER — Other Ambulatory Visit: Payer: Self-pay | Admitting: Family Medicine

## 2020-09-06 DIAGNOSIS — Z1231 Encounter for screening mammogram for malignant neoplasm of breast: Secondary | ICD-10-CM

## 2020-09-09 ENCOUNTER — Other Ambulatory Visit: Payer: Self-pay

## 2020-09-09 ENCOUNTER — Ambulatory Visit
Admission: RE | Admit: 2020-09-09 | Discharge: 2020-09-09 | Disposition: A | Discharge status: Home / Self Care | Payer: 59 | Source: Ambulatory Visit | Attending: Family Medicine | Admitting: Family Medicine

## 2020-09-09 DIAGNOSIS — Z1231 Encounter for screening mammogram for malignant neoplasm of breast: Secondary | ICD-10-CM

## 2020-09-29 ENCOUNTER — Other Ambulatory Visit (HOSPITAL_COMMUNITY): Payer: Self-pay

## 2020-09-29 MED FILL — Sertraline HCl Tab 50 MG: ORAL | 90 days supply | Qty: 90 | Fill #1 | Status: AC

## 2020-11-15 DIAGNOSIS — Z1211 Encounter for screening for malignant neoplasm of colon: Secondary | ICD-10-CM | POA: Diagnosis not present

## 2020-11-15 DIAGNOSIS — E669 Obesity, unspecified: Secondary | ICD-10-CM | POA: Diagnosis not present

## 2020-11-28 ENCOUNTER — Other Ambulatory Visit (HOSPITAL_COMMUNITY): Payer: Self-pay

## 2020-11-28 MED FILL — Bupropion HCl Tab ER 24HR 150 MG: ORAL | 90 days supply | Qty: 90 | Fill #2 | Status: AC

## 2020-12-27 ENCOUNTER — Other Ambulatory Visit (HOSPITAL_COMMUNITY): Payer: Self-pay

## 2020-12-27 DIAGNOSIS — F331 Major depressive disorder, recurrent, moderate: Secondary | ICD-10-CM | POA: Diagnosis not present

## 2020-12-27 DIAGNOSIS — F411 Generalized anxiety disorder: Secondary | ICD-10-CM | POA: Diagnosis not present

## 2020-12-27 DIAGNOSIS — G4700 Insomnia, unspecified: Secondary | ICD-10-CM | POA: Diagnosis not present

## 2020-12-27 MED ORDER — BUPROPION HCL ER (XL) 150 MG PO TB24
150.0000 mg | ORAL_TABLET | Freq: Every morning | ORAL | 3 refills | Status: DC
  Filled 2020-12-27 – 2021-06-01 (×2): qty 90, 90d supply, fill #0
  Filled 2021-08-28: qty 90, 90d supply, fill #1

## 2020-12-27 MED ORDER — ALPRAZOLAM 0.25 MG PO TABS
0.2500 mg | ORAL_TABLET | Freq: Every day | ORAL | 5 refills | Status: DC
  Filled 2020-12-27: qty 45, 8d supply, fill #0

## 2020-12-27 MED ORDER — SERTRALINE HCL 50 MG PO TABS
75.0000 mg | ORAL_TABLET | Freq: Every morning | ORAL | 3 refills | Status: DC
  Filled 2020-12-27: qty 135, 90d supply, fill #0
  Filled 2021-03-27: qty 135, 90d supply, fill #1
  Filled 2021-06-28: qty 135, 90d supply, fill #2
  Filled 2021-09-28: qty 135, 90d supply, fill #3
  Filled 2021-12-26: qty 40, 26d supply, fill #4

## 2021-01-17 ENCOUNTER — Other Ambulatory Visit (HOSPITAL_COMMUNITY): Payer: Self-pay

## 2021-01-17 MED ORDER — CLENPIQ 10-3.5-12 MG-GM -GM/160ML PO SOLN
ORAL | 0 refills | Status: DC
  Filled 2021-01-17: qty 320, 1d supply, fill #0

## 2021-01-25 ENCOUNTER — Other Ambulatory Visit (HOSPITAL_COMMUNITY): Payer: Self-pay

## 2021-02-27 ENCOUNTER — Other Ambulatory Visit (HOSPITAL_COMMUNITY): Payer: Self-pay

## 2021-02-27 MED FILL — Bupropion HCl Tab ER 24HR 150 MG: ORAL | 90 days supply | Qty: 90 | Fill #3 | Status: AC

## 2021-03-24 ENCOUNTER — Other Ambulatory Visit (HOSPITAL_COMMUNITY): Payer: Self-pay

## 2021-03-24 MED ORDER — CLENPIQ 10-3.5-12 MG-GM -GM/160ML PO SOLN
ORAL | 0 refills | Status: DC
  Filled 2021-03-24: qty 320, 1d supply, fill #0

## 2021-03-27 ENCOUNTER — Other Ambulatory Visit (HOSPITAL_COMMUNITY): Payer: Self-pay

## 2021-04-14 ENCOUNTER — Other Ambulatory Visit (HOSPITAL_COMMUNITY): Payer: Self-pay

## 2021-04-14 DIAGNOSIS — B3731 Acute candidiasis of vulva and vagina: Secondary | ICD-10-CM | POA: Insufficient documentation

## 2021-04-14 MED ORDER — FLUCONAZOLE 150 MG PO TABS
150.0000 mg | ORAL_TABLET | Freq: Every day | ORAL | 0 refills | Status: DC
  Filled 2021-04-14: qty 1, 1d supply, fill #0

## 2021-05-22 DIAGNOSIS — Z1211 Encounter for screening for malignant neoplasm of colon: Secondary | ICD-10-CM | POA: Diagnosis not present

## 2021-05-22 DIAGNOSIS — D122 Benign neoplasm of ascending colon: Secondary | ICD-10-CM | POA: Diagnosis not present

## 2021-05-22 DIAGNOSIS — D125 Benign neoplasm of sigmoid colon: Secondary | ICD-10-CM | POA: Diagnosis not present

## 2021-05-22 DIAGNOSIS — K5730 Diverticulosis of large intestine without perforation or abscess without bleeding: Secondary | ICD-10-CM | POA: Diagnosis not present

## 2021-05-22 DIAGNOSIS — K635 Polyp of colon: Secondary | ICD-10-CM | POA: Diagnosis not present

## 2021-05-22 DIAGNOSIS — K5140 Inflammatory polyps of colon without complications: Secondary | ICD-10-CM | POA: Diagnosis not present

## 2021-05-22 LAB — HM COLONOSCOPY

## 2021-06-01 ENCOUNTER — Other Ambulatory Visit (HOSPITAL_COMMUNITY): Payer: Self-pay

## 2021-06-28 ENCOUNTER — Other Ambulatory Visit (HOSPITAL_COMMUNITY): Payer: Self-pay

## 2021-07-05 ENCOUNTER — Other Ambulatory Visit (HOSPITAL_COMMUNITY): Payer: Self-pay

## 2021-07-05 DIAGNOSIS — H00016 Hordeolum externum left eye, unspecified eyelid: Secondary | ICD-10-CM | POA: Diagnosis not present

## 2021-07-05 DIAGNOSIS — F411 Generalized anxiety disorder: Secondary | ICD-10-CM | POA: Diagnosis not present

## 2021-07-05 DIAGNOSIS — G4700 Insomnia, unspecified: Secondary | ICD-10-CM | POA: Diagnosis not present

## 2021-07-05 DIAGNOSIS — F331 Major depressive disorder, recurrent, moderate: Secondary | ICD-10-CM | POA: Diagnosis not present

## 2021-07-05 MED ORDER — CEPHALEXIN 500 MG PO CAPS
500.0000 mg | ORAL_CAPSULE | Freq: Four times a day (QID) | ORAL | 0 refills | Status: AC
  Filled 2021-07-05: qty 40, 10d supply, fill #0

## 2021-07-05 MED ORDER — ERYTHROMYCIN 5 MG/GM OP OINT
1.0000 | TOPICAL_OINTMENT | Freq: Every day | OPHTHALMIC | 0 refills | Status: DC
  Filled 2021-07-05: qty 3.5, 10d supply, fill #0

## 2021-07-05 MED ORDER — ALPRAZOLAM 0.25 MG PO TABS
ORAL_TABLET | ORAL | 0 refills | Status: DC
  Filled 2021-07-05: qty 45, 8d supply, fill #0

## 2021-07-07 ENCOUNTER — Ambulatory Visit
Admission: RE | Admit: 2021-07-07 | Discharge: 2021-07-07 | Disposition: A | Discharge status: Home / Self Care | Payer: 59 | Source: Ambulatory Visit | Attending: Family Medicine | Admitting: Family Medicine

## 2021-07-07 ENCOUNTER — Other Ambulatory Visit (HOSPITAL_COMMUNITY): Payer: Self-pay

## 2021-07-07 VITALS — BP 120/90 | HR 70 | Temp 98.0°F | Resp 18

## 2021-07-07 DIAGNOSIS — H00015 Hordeolum externum left lower eyelid: Secondary | ICD-10-CM | POA: Diagnosis not present

## 2021-07-07 DIAGNOSIS — H00025 Hordeolum internum left lower eyelid: Secondary | ICD-10-CM | POA: Diagnosis not present

## 2021-07-07 MED ORDER — PREDNISOLONE ACETATE 1 % OP SUSP
1.0000 [drp] | Freq: Three times a day (TID) | OPHTHALMIC | 0 refills | Status: DC
  Filled 2021-07-07: qty 5, 10d supply, fill #0

## 2021-07-07 NOTE — ED Provider Notes (Signed)
[name]    CSN: [id number] Arrival date & time: 07/07/21  1101      History   Chief Complaint Chief Complaint  Patient presents with   Eye Problem    Started as a stye and has swollen down my face. - Entered by patient    HPI [name] is a 46 y.o. female.   Patient presents with left eye lower lid swelling and inflammation that started approximately 4 days ago.  Patient reports that she slept in her contacts on Tuesday night, then she woke up with a stye like lesion present to left lower lid.  She reports that the redness has spread into her face so she is concerned about this.  Denies fever, body aches, chills. She had a virtual visit the next day and was prescribed erythromycin as well as cephalexin antibiotic.  She started taking the erythromycin immediately and started taking cephalexin antibiotic yesterday.  She has not seen any improvement.  Denies any blurry vision that is different from baseline.  She has kept her contacts out.  Denies any purulent drainage or crustiness.   Eye Problem   Past Medical History:  Diagnosis Date   H/O dilation and curettage 2015   Hx of varicella    Melanoma ([place]) 2013   Postpartum Santos following vaginal delivery (3/31) 04/08/2014   Seasonal allergies     Patient Active Problem List   Diagnosis Date Noted   Active labor 04/08/2014   Postpartum Santos following vaginal delivery (3/31) 04/08/2014   [redacted] weeks gestation of pregnancy    Pregnancy complicated by fetal genitourinary abnormality    AMA (advanced maternal age) multigravida 35+    Placenta previa found during pregnancy without hemorrhage    [redacted] weeks gestation of pregnancy    Fetal abnormality affecting management of mother    [redacted] weeks gestation of pregnancy    Low lying placenta without hemorrhage, antepartum    Pregnancy complicated by fetal GU abnormality    Maternal age 64+, multigravida, antepartum    Fetal renal anomaly    Advanced maternal  age in multigravida    Other known or suspected fetal abnormality, not elsewhere classified, affecting management of mother, antepartum condition or complication    Elderly multigravida with antepartum condition or complication     Past Surgical History:  Procedure Laterality Date   DILATION AND CURETTAGE OF UTERUS     SKIN SURGERY      OB History     Gravida  3   Para  2   Term  2   Preterm      AB  1   Living  2      SAB  1   IAB      Ectopic      Multiple  0   Live Births  2            Home Medications    Prior to Admission medications   Medication Sig Start Date End Date Taking? Authorizing Provider  albuterol (VENTOLIN HFA) 108 (90 Base) MCG/ACT inhaler INHALE 2 PUFFS BY MOUTH INTO THE LUNGS EVERY 4 HOURS AS NEEDED 04/05/20 04/05/21  Fanny Bien, MD  ALPRAZolam Duanne Moron) 0.25 MG tablet Take 1-2 tablets (0.25-0.5 mg total) by mouth at bedtime and up to 3 times a day when needed for panic attacks 12/27/20     ALPRAZolam (XANAX) 0.25 MG tablet Take 1-2  tablets at bedtime, and up to 3 times a day when  needed for panic attacks 07/05/21     azithromycin (ZITHROMAX) 250 MG tablet Take 2 tablets by mouth on day 1 and then 1 tablet on days 2 to 5. 06/08/20     buPROPion (WELLBUTRIN XL) 150 MG 24 hr tablet Take 1 tablet (150 mg total) by mouth every morning. 04/05/20   Fanny Bien, MD  buPROPion (WELLBUTRIN XL) 150 MG 24 hr tablet TAKE 1 TABLET BY MOUTH ONCE A DAY IN THE MORNING 12/02/19 12/01/20  Fanny Bien, MD  buPROPion (WELLBUTRIN XL) 150 MG 24 hr tablet Take 1 tablet (150 mg total) by mouth in the morning. 12/27/20     cephALEXin (KEFLEX) 500 MG capsule Take 1 capsule (500 mg total) by mouth 4 (four) times daily for 10 days. 07/05/21 07/15/21    Cholecalciferol (VITAMIN D) 2000 UNITS tablet Take 2,000 Units by mouth daily.    [provider]  COVID-19 At Home Antigen Test [facility] COVID-19 HOME TEST) KIT USE AS DIRECTED WITHIN PACKAGE  INSTRUCTIONS. 07/06/20   Jefm Bryant, RPH  doxycycline (VIBRAMYCIN) 100 MG capsule Take 100 mg by mouth daily.    [provider]  erythromycin ophthalmic ointment apply 1 application (1 cm ribbon) in eye up to 6 (six) times daily. 07/05/21     escitalopram (LEXAPRO) 10 MG tablet Take 10 mg by mouth daily.    [provider]  fluconazole (DIFLUCAN) 150 MG tablet Take 1 tablet (150 mg total) by mouth once for 1 dose. 04/14/21     fluticasone (FLONASE) 50 MCG/ACT nasal spray Place into both nostrils daily.    [provider]  fluticasone (FLONASE) 50 MCG/ACT nasal spray USE 2 SPRAYS INTO EACH NOSTRIL ONCE DAILY 04/05/20 04/05/21  Fanny Bien, MD  loratadine (CLARITIN) 10 MG tablet Take 10 mg by mouth daily.    [provider]  meloxicam (MOBIC) 15 MG tablet Take one pill a day with food for 7 days and then prn thereafter 01/21/18   Thurman Coyer, DO  Multiple Vitamin (MULTI VITAMIN DAILY PO) Take by mouth.    [provider]  sertraline (ZOLOFT) 50 MG tablet TAKE 1 TABLET BY MOUTH ONCE DAILY IN THE MORNING 04/05/20 04/05/21  Fanny Bien, MD  sertraline (ZOLOFT) 50 MG tablet Take 1.5 tablets (75 mg total) by mouth in the morning. 12/27/20     Sod Picosulfate-Mag Ox-Cit Acd (CLENPIQ) 10-3.5-12 MG-GM -GM/160ML SOLN Use as directed per office and not instructions on packaging. Do not refrigerate 01/17/21     Sod Picosulfate-Mag Ox-Cit Acd (CLENPIQ) 10-3.5-12 MG-GM -GM/160ML SOLN Use as directed 03/24/21       Family History Family History  Problem Relation Age of Onset   Stroke Father    Hypertension Father    Breast cancer Paternal Aunt        16s    Social History Social History   Tobacco Use   Smoking status: Never  Substance Use Topics   Alcohol use: No   Drug use: No     Allergies   Patient has no known allergies.   Review of Systems Review of Systems Per HPI  Physical Exam Triage Vital Signs ED Triage Vitals  Enc  Vitals Group     BP 07/07/21 1112 120/90     Pulse Rate 07/07/21 1112 70     Resp 07/07/21 1112 18     Temp 07/07/21 1112 98 F (36.7 C)     Temp Source 07/07/21 1112 Oral  SpO2 07/07/21 1112 98 %     Weight --      Height --      Head Circumference --      Peak Flow --      Pain Score 07/07/21 1113 0     Pain Loc --      Pain Edu? --      Excl. in [place]? --    No data found.  Updated Vital Signs BP 120/90 (BP Location: Left Arm)   Pulse 70   Temp 98 F (36.7 C) (Oral)   Resp 18   SpO2 98%   Breastfeeding No   Visual Acuity Right Eye Distance:   Left Eye Distance:   Bilateral Distance:    Right Eye Near:   Left Eye Near:    Bilateral Near:     Physical Exam Constitutional:      General: She is not in acute distress.    Appearance: Normal appearance. She is not toxic-appearing or diaphoretic.  HENT:     Head: Normocephalic and atraumatic.  Eyes:     General: Lids are everted, no foreign bodies appreciated. Vision grossly intact. Gaze aligned appropriately.        Left eye: Hordeolum present.    Extraocular Movements: Extraocular movements intact.     Conjunctiva/sclera: Conjunctivae normal.     Pupils: Pupils are equal, round, and reactive to light.      Comments: Stye to left lower lid.  Redness spreads from stye to left cheek of face.  No tenderness to palpation.  No obvious swelling.  Pulmonary:     Effort: Pulmonary effort is normal.  Neurological:     General: No focal deficit present.     Mental Status: She is alert and oriented to person, place, and time. Mental status is at baseline.  Psychiatric:        Mood and Affect: Mood normal.        Behavior: Behavior normal.        Thought Content: Thought content normal.        Judgment: Judgment normal.      UC Treatments / Results  Labs (all labs ordered are listed, but only abnormal results are displayed) Labs Reviewed - No data to display  EKG   Radiology No results  found.  Procedures Procedures (including critical Santos time)  Medications Ordered in UC Medications - No data to display  Initial Impression / Assessment and Plan / UC Course  I have reviewed the triage vital signs and the nursing notes.  Pertinent labs & imaging results that were available during my Santos of the patient were reviewed by me and considered in my medical decision making (see chart for details).     Dr. Stacie Glaze with on-call ophthalmology was called to consult about patient's symptoms.  She advised to follow-up at her office with Dr. Hollice Espy for further evaluation and management today.  Patient was advised to go to [facility] today as soon as she left urgent Santos for further evaluation and management.  Patient provided with contact information and address for this office.  Patient verbalized understanding and was agreeable with plan. Final Clinical Impressions(s) / UC Diagnoses   Final diagnoses:  Hordeolum externum of left lower eyelid     Discharge Instructions      Please go to provided address for ophthalmology for further evaluation and management and you will be seeing Dr. Hollice Espy.    ED Prescriptions   None  PDMP not reviewed this encounter.   Teodora Medici, [place] 07/07/21 1150

## 2021-07-07 NOTE — ED Triage Notes (Signed)
Pt c/o facial swelling to left eye. States currently taking topical and oral abx given via telehealth.

## 2021-07-07 NOTE — Discharge Instructions (Signed)
Please go to provided address for ophthalmology for further evaluation and management and you will be seeing Dr. Hollice Espy.

## 2021-08-25 DIAGNOSIS — D2272 Melanocytic nevi of left lower limb, including hip: Secondary | ICD-10-CM | POA: Diagnosis not present

## 2021-08-25 DIAGNOSIS — D2271 Melanocytic nevi of right lower limb, including hip: Secondary | ICD-10-CM | POA: Diagnosis not present

## 2021-08-25 DIAGNOSIS — D2262 Melanocytic nevi of left upper limb, including shoulder: Secondary | ICD-10-CM | POA: Diagnosis not present

## 2021-08-25 DIAGNOSIS — D2261 Melanocytic nevi of right upper limb, including shoulder: Secondary | ICD-10-CM | POA: Diagnosis not present

## 2021-08-25 DIAGNOSIS — D225 Melanocytic nevi of trunk: Secondary | ICD-10-CM | POA: Diagnosis not present

## 2021-08-25 DIAGNOSIS — L821 Other seborrheic keratosis: Secondary | ICD-10-CM | POA: Diagnosis not present

## 2021-08-25 DIAGNOSIS — D1801 Hemangioma of skin and subcutaneous tissue: Secondary | ICD-10-CM | POA: Diagnosis not present

## 2021-08-25 DIAGNOSIS — Z85820 Personal history of malignant melanoma of skin: Secondary | ICD-10-CM | POA: Diagnosis not present

## 2021-08-25 DIAGNOSIS — D2372 Other benign neoplasm of skin of left lower limb, including hip: Secondary | ICD-10-CM | POA: Diagnosis not present

## 2021-08-28 ENCOUNTER — Other Ambulatory Visit (HOSPITAL_COMMUNITY): Payer: Self-pay

## 2021-09-28 ENCOUNTER — Other Ambulatory Visit (HOSPITAL_COMMUNITY): Payer: Self-pay

## 2021-09-29 ENCOUNTER — Other Ambulatory Visit (HOSPITAL_COMMUNITY): Payer: Self-pay

## 2021-10-23 ENCOUNTER — Other Ambulatory Visit: Payer: Self-pay | Admitting: Nurse Practitioner

## 2021-10-23 ENCOUNTER — Other Ambulatory Visit: Payer: Self-pay | Admitting: *Deleted

## 2021-10-23 DIAGNOSIS — Z1231 Encounter for screening mammogram for malignant neoplasm of breast: Secondary | ICD-10-CM

## 2021-10-24 ENCOUNTER — Encounter: Payer: Self-pay | Admitting: Nurse Practitioner

## 2021-10-24 ENCOUNTER — Ambulatory Visit (INDEPENDENT_AMBULATORY_CARE_PROVIDER_SITE_OTHER): Payer: 59 | Admitting: Nurse Practitioner

## 2021-10-24 VITALS — BP 121/87 | HR 72 | Ht 69.0 in | Wt 217.0 lb

## 2021-10-24 DIAGNOSIS — F411 Generalized anxiety disorder: Secondary | ICD-10-CM | POA: Diagnosis not present

## 2021-10-24 DIAGNOSIS — Z6832 Body mass index (BMI) 32.0-32.9, adult: Secondary | ICD-10-CM | POA: Diagnosis not present

## 2021-10-24 DIAGNOSIS — Z7689 Persons encountering health services in other specified circumstances: Secondary | ICD-10-CM

## 2021-10-24 DIAGNOSIS — N951 Menopausal and female climacteric states: Secondary | ICD-10-CM

## 2021-10-24 DIAGNOSIS — Z23 Encounter for immunization: Secondary | ICD-10-CM

## 2021-10-24 NOTE — Progress Notes (Signed)
New Patient Office Visit  Subjective    Patient ID: [name], female    DOB: [birth year]  Age: 46 y.o. MRN: [id number]  CC:  Chief Complaint  Patient presents with   New Patient (Initial Visit)    HPI [name] presents to establish care -patient is relocating from a different local primary care provider.  -recently scheduled herself mammogram in November.  -would like to see GYN for wel woman care and to discuss menopause symptoms.  -did have colonoscopy earlier this year. Done through [facility].  -would like to have flu shot today.  -due to have routine, fasting labs  -does have anxiety/depression. Generally well managed on current medication.    Outpatient Encounter Medications as of 10/24/2021  Medication Sig   ALPRAZolam (XANAX) 0.25 MG tablet Take 1-2  tablets at bedtime, and up to 3 times a day when needed for panic attacks   buPROPion (WELLBUTRIN XL) 150 MG 24 hr tablet Take 1 tablet (150 mg total) by mouth every morning.   fluticasone (FLONASE) 50 MCG/ACT nasal spray Place into both nostrils daily.   loratadine (CLARITIN) 10 MG tablet Take 10 mg by mouth daily.   Multiple Vitamin (MULTI VITAMIN DAILY PO) Take by mouth.   sertraline (ZOLOFT) 50 MG tablet Take 1.5 tablets (75 mg total) by mouth in the morning.   albuterol (VENTOLIN HFA) 108 (90 Base) MCG/ACT inhaler INHALE 2 PUFFS BY MOUTH INTO THE LUNGS EVERY 4 HOURS AS NEEDED   buPROPion (WELLBUTRIN XL) 150 MG 24 hr tablet TAKE 1 TABLET BY MOUTH ONCE A DAY IN THE MORNING   [DISCONTINUED] ALPRAZolam (XANAX) 0.25 MG tablet Take 1-2 tablets (0.25-0.5 mg total) by mouth at bedtime and up to 3 times a day when needed for panic attacks (Patient not taking: Reported on 10/24/2021)   [DISCONTINUED] azithromycin (ZITHROMAX) 250 MG tablet Take 2 tablets by mouth on day 1 and then 1 tablet on days 2 to 5.   [DISCONTINUED] buPROPion (WELLBUTRIN XL) 150 MG 24 hr tablet Take 1 tablet (150 mg total)  by mouth in the morning.   [DISCONTINUED] Cholecalciferol (VITAMIN D) 2000 UNITS tablet Take 2,000 Units by mouth daily. (Patient not taking: Reported on 10/24/2021)   [DISCONTINUED] COVID-19 At Home Antigen Test ([facility] COVID-19 HOME TEST) KIT USE AS DIRECTED WITHIN PACKAGE INSTRUCTIONS.   [DISCONTINUED] doxycycline (VIBRAMYCIN) 100 MG capsule Take 100 mg by mouth daily.   [DISCONTINUED] erythromycin ophthalmic ointment apply 1 application (1 cm ribbon) in eye up to 6 (six) times daily.   [DISCONTINUED] escitalopram (LEXAPRO) 10 MG tablet Take 10 mg by mouth daily.   [DISCONTINUED] fluconazole (DIFLUCAN) 150 MG tablet Take 1 tablet (150 mg total) by mouth once for 1 dose.   [DISCONTINUED] fluticasone (FLONASE) 50 MCG/ACT nasal spray USE 2 SPRAYS INTO EACH NOSTRIL ONCE DAILY   [DISCONTINUED] meloxicam (MOBIC) 15 MG tablet Take one pill a day with food for 7 days and then prn thereafter   [DISCONTINUED] prednisoLONE acetate (PRED FORTE) 1 % ophthalmic suspension Place 1 drop into the left eye 3 (three) times daily for 7-10 days   [DISCONTINUED] sertraline (ZOLOFT) 50 MG tablet TAKE 1 TABLET BY MOUTH ONCE DAILY IN THE MORNING   [DISCONTINUED] Sod Picosulfate-Mag Ox-Cit Acd (CLENPIQ) 10-3.5-12 MG-GM -GM/160ML SOLN Use as directed per office and not instructions on packaging. Do not refrigerate   [DISCONTINUED] Sod Picosulfate-Mag Ox-Cit Acd (CLENPIQ) 10-3.5-12 MG-GM -GM/160ML SOLN Use as directed   No facility-administered encounter medications on file as  of 10/24/2021.    Past Medical History:  Diagnosis Date   H/O dilation and curettage 2015   Hx of varicella    Melanoma ([place]) 2013   Postpartum care following vaginal delivery (3/31) 04/08/2014   Seasonal allergies     Past Surgical History:  Procedure Laterality Date   DILATION AND CURETTAGE OF UTERUS     SKIN SURGERY      Family History  Problem Relation Age of Onset   Stroke Father    Hypertension Father    Breast cancer  Paternal Aunt        50s    Social History   Socioeconomic History   Marital status: Married    Spouse name: Not on file   Number of children: Not on file   Years of education: Not on file   Highest education level: Not on file  Occupational History   Not on file  Tobacco Use   Smoking status: Never   Smokeless tobacco: Not on file  Substance and Sexual Activity   Alcohol use: No   Drug use: No   Sexual activity: Yes  Other Topics Concern   Not on file  Social History Narrative   Not on file   Social Determinants of Health   Financial Resource Strain: Not on file  Food Insecurity: Not on file  Transportation Needs: Not on file  Physical Activity: Not on file  Stress: Not on file  Social Connections: Not on file  Intimate Partner Violence: Not on file    Review of Systems  Constitutional:  Negative for chills, fever and malaise/fatigue.  HENT:  Negative for congestion, sinus pain and sore throat.   Eyes: Negative.   Respiratory:  Negative for cough, shortness of breath and wheezing.   Cardiovascular:  Negative for chest pain, palpitations and leg swelling.  Gastrointestinal:  Negative for constipation, diarrhea, nausea and vomiting.  Genitourinary: Negative.   Musculoskeletal:  Negative for myalgias.  Skin: Negative.   Neurological:  Negative for dizziness and headaches.  Endo/Heme/Allergies:  Does not bruise/bleed easily.  Psychiatric/Behavioral:  Positive for depression. The patient is nervous/anxious.        Generally well managed with current medication         Objective    Today's Vitals   10/24/21 1543  BP: 121/87  Pulse: 72  SpO2: 96%  Weight: 217 lb (98.4 kg)  Height: 5' 9" (1.753 m)   Body mass index is 32.05 kg/m.   Physical Exam Vitals and nursing note reviewed.  Constitutional:      Appearance: Normal appearance. She is well-developed.  HENT:     Head: Normocephalic and atraumatic.  Eyes:     Pupils: Pupils are equal, round, and  reactive to light.  Cardiovascular:     Rate and Rhythm: Normal rate and regular rhythm.     Pulses: Normal pulses.     Heart sounds: Normal heart sounds.  Pulmonary:     Effort: Pulmonary effort is normal.     Breath sounds: Normal breath sounds.  Abdominal:     Palpations: Abdomen is soft.  Musculoskeletal:        General: Normal range of motion.     Cervical back: Normal range of motion and neck supple.  Lymphadenopathy:     Cervical: No cervical adenopathy.  Skin:    General: Skin is warm and dry.     Capillary Refill: Capillary refill takes less than 2 seconds.  Neurological:     General:  No focal deficit present.     Mental Status: She is alert and oriented to person, place, and time.  Psychiatric:        Mood and Affect: Mood normal.        Behavior: Behavior normal.        Thought Content: Thought content normal.        Judgment: Judgment normal.       Assessment & Plan:  1. Perimenopause Referral placed to GYN per patient request to discuss menopausal symptoms and to have well woman care  - Ambulatory referral to Gynecology  2. Generalized anxiety disorder Stable. Continue medication as prescribed. Will renew as indicated.   3. BMI 32.0-32.9,adult Discussed lowering calorie intake to 1500 calories per day and incorporating exercise into daily routine to help lose weight.   4. Encounter to establish care Appointment today to establish new primary care provider    5. Need for influenza vaccination Flu vaccine administered during today's visit.  - Flu Vaccine QUAD 6+ mos PF IM (Fluarix Quad PF)   Problem List Items Addressed This Visit       Other   Perimenopause - Primary   Relevant Orders   Ambulatory referral to Gynecology   Generalized anxiety disorder   BMI 32.0-32.9,adult   Other Visit Diagnoses     Encounter to establish care       Need for influenza vaccination       Relevant Orders   Flu Vaccine QUAD 6+ mos PF IM (Fluarix Quad PF)  (Completed)       Return in about 2 months (around 12/24/2021) for health maintenance exam, FBW a week prior to visit. please get colonoscopy from [facility], .   Ronnell Freshwater, NP

## 2021-11-06 DIAGNOSIS — Z6832 Body mass index (BMI) 32.0-32.9, adult: Secondary | ICD-10-CM | POA: Insufficient documentation

## 2021-11-06 DIAGNOSIS — F411 Generalized anxiety disorder: Secondary | ICD-10-CM | POA: Insufficient documentation

## 2021-11-06 DIAGNOSIS — N951 Menopausal and female climacteric states: Secondary | ICD-10-CM | POA: Insufficient documentation

## 2021-11-06 DIAGNOSIS — Z6831 Body mass index (BMI) 31.0-31.9, adult: Secondary | ICD-10-CM | POA: Insufficient documentation

## 2021-11-27 ENCOUNTER — Ambulatory Visit
Admission: RE | Admit: 2021-11-27 | Discharge: 2021-11-27 | Disposition: A | Discharge status: Home / Self Care | Payer: 59 | Source: Ambulatory Visit | Attending: Nurse Practitioner | Admitting: Nurse Practitioner

## 2021-11-27 ENCOUNTER — Other Ambulatory Visit: Payer: Self-pay | Admitting: Nurse Practitioner

## 2021-11-27 ENCOUNTER — Other Ambulatory Visit (HOSPITAL_COMMUNITY): Payer: Self-pay

## 2021-11-27 DIAGNOSIS — F411 Generalized anxiety disorder: Secondary | ICD-10-CM

## 2021-11-27 DIAGNOSIS — Z1231 Encounter for screening mammogram for malignant neoplasm of breast: Secondary | ICD-10-CM

## 2021-11-27 MED ORDER — BUPROPION HCL ER (XL) 150 MG PO TB24
150.0000 mg | ORAL_TABLET | Freq: Every morning | ORAL | 3 refills | Status: DC
  Filled 2021-11-27: qty 90, 90d supply, fill #0
  Filled 2022-02-26: qty 90, 90d supply, fill #1
  Filled 2022-05-26: qty 90, 90d supply, fill #2
  Filled 2022-08-21: qty 90, 90d supply, fill #3

## 2021-11-27 NOTE — Telephone Encounter (Signed)
I just sent new prescription from her medication list.

## 2021-11-29 DIAGNOSIS — H52223 Regular astigmatism, bilateral: Secondary | ICD-10-CM | POA: Diagnosis not present

## 2021-12-03 NOTE — Progress Notes (Signed)
Negative mammogram. Repeat in one year .

## 2021-12-05 ENCOUNTER — Ambulatory Visit: Payer: 59

## 2021-12-21 ENCOUNTER — Other Ambulatory Visit: Payer: Self-pay

## 2021-12-21 DIAGNOSIS — Z0000 Encounter for general adult medical examination without abnormal findings: Secondary | ICD-10-CM

## 2021-12-26 ENCOUNTER — Other Ambulatory Visit (HOSPITAL_COMMUNITY): Payer: Self-pay

## 2021-12-26 ENCOUNTER — Other Ambulatory Visit: Payer: Self-pay

## 2021-12-29 ENCOUNTER — Other Ambulatory Visit: Payer: 59

## 2021-12-29 DIAGNOSIS — Z0000 Encounter for general adult medical examination without abnormal findings: Secondary | ICD-10-CM

## 2021-12-30 LAB — COMPREHENSIVE METABOLIC PANEL
ALT: 92 IU/L — ABNORMAL HIGH (ref 0–32)
AST: 51 IU/L — ABNORMAL HIGH (ref 0–40)
Albumin/Globulin Ratio: 2.2 (ref 1.2–2.2)
Albumin: 4.4 g/dL (ref 3.9–4.9)
Alkaline Phosphatase: 77 IU/L (ref 44–121)
BUN/Creatinine Ratio: 17 (ref 9–23)
BUN: 15 mg/dL (ref 6–24)
Bilirubin Total: 0.2 mg/dL (ref 0.0–1.2)
CO2: 23 mmol/L (ref 20–29)
Calcium: 8.6 mg/dL — ABNORMAL LOW (ref 8.7–10.2)
Chloride: 103 mmol/L (ref 96–106)
Creatinine, Ser: 0.89 mg/dL (ref 0.57–1.00)
Globulin, Total: 2 g/dL (ref 1.5–4.5)
Glucose: 110 mg/dL — ABNORMAL HIGH (ref 70–99)
Potassium: 4.9 mmol/L (ref 3.5–5.2)
Sodium: 138 mmol/L (ref 134–144)
Total Protein: 6.4 g/dL (ref 6.0–8.5)
eGFR: 80 mL/min/{1.73_m2} (ref >59)

## 2021-12-30 LAB — CBC WITH DIFFERENTIAL/PLATELET
Basophils Absolute: 0 10*3/uL (ref 0.0–0.2)
Basos: 1 %
EOS (ABSOLUTE): 0.1 10*3/uL (ref 0.0–0.4)
Eos: 3 %
Hematocrit: 38.9 % (ref 34.0–46.6)
Hemoglobin: 13.5 g/dL (ref 11.1–15.9)
Immature Grans (Abs): 0 10*3/uL (ref 0.0–0.1)
Immature Granulocytes: 0 %
Lymphocytes Absolute: 1.4 10*3/uL (ref 0.7–3.1)
Lymphs: 46 %
MCH: 30.7 pg (ref 26.6–33.0)
MCHC: 34.7 g/dL (ref 31.5–35.7)
MCV: 88 fL (ref 79–97)
Monocytes Absolute: 0.2 10*3/uL (ref 0.1–0.9)
Monocytes: 8 %
Neutrophils Absolute: 1.3 10*3/uL — ABNORMAL LOW (ref 1.4–7.0)
Neutrophils: 42 %
Platelets: 122 10*3/uL — ABNORMAL LOW (ref 150–450)
RBC: 4.4 x10E6/uL (ref 3.77–5.28)
RDW: 11.3 % — ABNORMAL LOW (ref 11.7–15.4)
WBC: 3.1 10*3/uL — ABNORMAL LOW (ref 3.4–10.8)

## 2021-12-30 LAB — LIPID PANEL
Chol/HDL Ratio: 3.2 ratio (ref 0.0–4.4)
Cholesterol, Total: 140 mg/dL (ref 100–199)
HDL: 44 mg/dL (ref >39)
LDL Chol Calc (NIH): 79 mg/dL (ref 0–99)
Triglycerides: 92 mg/dL (ref 0–149)
VLDL Cholesterol Cal: 17 mg/dL (ref 5–40)

## 2021-12-30 LAB — HEMOGLOBIN A1C
Est. average glucose Bld gHb Est-mCnc: 126 mg/dL
Hgb A1c MFr Bld: 6 % — ABNORMAL HIGH (ref 4.8–5.6)

## 2021-12-30 LAB — TSH: TSH: 3.81 u[IU]/mL (ref 0.450–4.500)

## 2022-01-01 NOTE — Progress Notes (Signed)
Elevated LFTs and decreased WBC. Recheck at visit 01/05/2022

## 2022-01-05 ENCOUNTER — Ambulatory Visit (INDEPENDENT_AMBULATORY_CARE_PROVIDER_SITE_OTHER): Payer: 59 | Admitting: Nurse Practitioner

## 2022-01-05 ENCOUNTER — Encounter: Payer: Self-pay | Admitting: Nurse Practitioner

## 2022-01-05 VITALS — BP 119/85 | HR 74 | Ht 69.0 in | Wt 218.4 lb

## 2022-01-05 DIAGNOSIS — R7989 Other specified abnormal findings of blood chemistry: Secondary | ICD-10-CM | POA: Diagnosis not present

## 2022-01-05 DIAGNOSIS — Z0001 Encounter for general adult medical examination with abnormal findings: Secondary | ICD-10-CM

## 2022-01-05 DIAGNOSIS — F411 Generalized anxiety disorder: Secondary | ICD-10-CM | POA: Diagnosis not present

## 2022-01-05 DIAGNOSIS — Z6832 Body mass index (BMI) 32.0-32.9, adult: Secondary | ICD-10-CM

## 2022-01-05 DIAGNOSIS — D729 Disorder of white blood cells, unspecified: Secondary | ICD-10-CM

## 2022-01-05 NOTE — Progress Notes (Signed)
Complete physical exam   Patient: [name]   DOB: 07/01/1975   46 y.o. Female  MRN: [id number] Visit Date: 01/05/2022    Chief Complaint  Patient presents with   Annual Exam   Subjective    [name] is a 46 y.o. female who presents today for a complete physical exam.  She reports consuming a  generally healthy  diet.  Incorporating at least 30 minutes of walking/jogging into her daily scheudle.   She generally feels well. She does not have additional problems to discuss today.   HPI  Annual physical  -routine fasting labs done prior to this visit.  --elevated LFTs --blood sugar elevated 110 with HgbA1c 6.0  --WBC slightly low at 3.1  --other labs essentially normal   She denies chest pain, chest pressure, or shortness of breath. She denies headaches or visual disturbances. She denies abdominal pain, nausea, vomiting, or changes in bowel or bladder habits.     Past Medical History:  Diagnosis Date   H/O dilation and curettage 2015   Hx of varicella    Melanoma ([place]) 2013   Postpartum care following vaginal delivery (3/31) 04/08/2014   Seasonal allergies    Past Surgical History:  Procedure Laterality Date   DILATION AND CURETTAGE OF UTERUS     SKIN SURGERY     Social History   Socioeconomic History   Marital status: Married    Spouse name: Not on file   Number of children: Not on file   Years of education: Not on file   Highest education level: Not on file  Occupational History   Not on file  Tobacco Use   Smoking status: Never   Smokeless tobacco: Not on file  Substance and Sexual Activity   Alcohol use: No   Drug use: No   Sexual activity: Yes  Other Topics Concern   Not on file  Social History Narrative   Not on file   Social Determinants of Health   Financial Resource Strain: Not on file  Food Insecurity: Not on file  Transportation Needs: Not on file  Physical Activity: Not on file  Stress: Not on file  Social  Connections: Not on file  Intimate Partner Violence: Not on file   Family Status  Relation Name Status   Father  Deceased   Mother  Alive   Brother  Alive   Daughter  Alive   MGM  Deceased   MGF  Deceased   PGM  Deceased   PGF  Deceased   Field seismologist  (Not Specified)   Family History  Problem Relation Age of Onset   Stroke Father    Hypertension Father    Breast cancer Paternal Aunt        76s   No Known Allergies  Patient Care Team: Ronnell Freshwater, NP as PCP - General (Family Medicine)   Medications: Outpatient Medications Prior to Visit  Medication Sig   ALPRAZolam (XANAX) 0.25 MG tablet Take 1-2  tablets at bedtime, and up to 3 times a day when needed for panic attacks   buPROPion (WELLBUTRIN XL) 150 MG 24 hr tablet Take 1 tablet (150 mg total) by mouth every morning.   fluticasone (FLONASE) 50 MCG/ACT nasal spray Place into both nostrils daily.   loratadine (CLARITIN) 10 MG tablet Take 10 mg by mouth daily.   Multiple Vitamin (MULTI VITAMIN DAILY PO) Take by mouth.   [DISCONTINUED] sertraline (ZOLOFT) 50 MG tablet Take 1.5 tablets (75 mg  total) by mouth in the morning.   albuterol (VENTOLIN HFA) 108 (90 Base) MCG/ACT inhaler INHALE 2 PUFFS BY MOUTH INTO THE LUNGS EVERY 4 HOURS AS NEEDED   No facility-administered medications prior to visit.    Review of Systems  Constitutional:  Negative for activity change, appetite change, chills, fatigue and fever.  HENT:  Negative for congestion, postnasal drip, rhinorrhea, sinus pressure, sinus pain, sneezing and sore throat.   Eyes: Negative.   Respiratory:  Negative for cough, chest tightness, shortness of breath and wheezing.   Cardiovascular:  Negative for chest pain and palpitations.  Gastrointestinal:  Negative for abdominal pain, constipation, diarrhea, nausea and vomiting.  Endocrine: Negative for cold intolerance, heat intolerance, polydipsia and polyuria.  Genitourinary:  Negative for dyspareunia, dysuria, flank  pain, frequency and urgency.  Musculoskeletal:  Negative for arthralgias, back pain and myalgias.  Skin:  Negative for rash.  Allergic/Immunologic: Negative for environmental allergies.  Neurological:  Negative for dizziness, weakness and headaches.  Hematological:  Negative for adenopathy.  Psychiatric/Behavioral:  The patient is not nervous/anxious.     Last CBC Lab Results  Component Value Date   WBC 3.1 (L) 12/29/2021   HGB 13.5 12/29/2021   HCT 38.9 12/29/2021   MCV 88 12/29/2021   MCH 30.7 12/29/2021   RDW 11.3 (L) 12/29/2021   PLT 122 (L) 95/32/0233   Last metabolic panel Lab Results  Component Value Date   GLUCOSE 110 (H) 12/29/2021   NA 138 12/29/2021   K 4.9 12/29/2021   CL 103 12/29/2021   CO2 23 12/29/2021   BUN 15 12/29/2021   CREATININE 0.89 12/29/2021   EGFR 80 12/29/2021   CALCIUM 8.6 (L) 12/29/2021   PROT 6.4 12/29/2021   ALBUMIN 4.4 12/29/2021   LABGLOB 2.0 12/29/2021   AGRATIO 2.2 12/29/2021   BILITOT 0.2 12/29/2021   ALKPHOS 77 12/29/2021   AST 51 (H) 12/29/2021   ALT 92 (H) 12/29/2021   Last lipids Lab Results  Component Value Date   CHOL 140 12/29/2021   HDL 44 12/29/2021   LDLCALC 79 12/29/2021   TRIG 92 12/29/2021   CHOLHDL 3.2 12/29/2021   Last hemoglobin A1c Lab Results  Component Value Date   HGBA1C 6.0 (H) 12/29/2021   Last thyroid functions Lab Results  Component Value Date   TSH 3.810 12/29/2021        Objective     Today's Vitals   01/05/22 1023  BP: 119/85  Pulse: 74  SpO2: 95%  Weight: 218 lb 6.4 oz (99.1 kg)  Height: '5\' 9"'  (1.753 m)   Body mass index is 32.25 kg/m.  BP Readings from Last 3 Encounters:  01/05/22 119/85  10/24/21 121/87  07/07/21 120/90    Wt Readings from Last 3 Encounters:  01/05/22 218 lb 6.4 oz (99.1 kg)  10/24/21 217 lb (98.4 kg)  02/11/18 190 lb (86.2 kg)     Physical Exam Vitals and nursing note reviewed.  Constitutional:      Appearance: Normal appearance. She is  well-developed.  HENT:     Head: Normocephalic and atraumatic.     Right Ear: Tympanic membrane, ear canal and external ear normal.     Left Ear: Tympanic membrane, ear canal and external ear normal.     Nose: Nose normal.     Mouth/Throat:     Mouth: Mucous membranes are moist.     Pharynx: Oropharynx is clear.  Eyes:     Extraocular Movements: Extraocular movements intact.  Conjunctiva/sclera: Conjunctivae normal.     Pupils: Pupils are equal, round, and reactive to light.  Cardiovascular:     Rate and Rhythm: Normal rate and regular rhythm.     Pulses: Normal pulses.     Heart sounds: Normal heart sounds.  Pulmonary:     Effort: Pulmonary effort is normal.     Breath sounds: Normal breath sounds.  Abdominal:     General: Bowel sounds are normal. There is no distension.     Palpations: Abdomen is soft. There is no mass.     Tenderness: There is no abdominal tenderness. There is no right CVA tenderness, left CVA tenderness, guarding or rebound.     Hernia: No hernia is present.  Musculoskeletal:        General: Normal range of motion.     Cervical back: Normal range of motion and neck supple.  Lymphadenopathy:     Cervical: No cervical adenopathy.  Skin:    General: Skin is warm and dry.     Capillary Refill: Capillary refill takes less than 2 seconds.  Neurological:     General: No focal deficit present.     Mental Status: She is alert and oriented to person, place, and time.  Psychiatric:        Mood and Affect: Mood normal.        Behavior: Behavior normal.        Thought Content: Thought content normal.        Judgment: Judgment normal.    Last depression screening scores   Row Labels 01/05/2022   10:27 AM 10/24/2021    3:50 PM 02/19/2014    8:36 AM  PHQ 2/9 Scores   Section Header. No data exists in this row.     PHQ - 2 Score   2 2 0  PHQ- 9 Score   7 7    Last fall risk screening   Row Labels 02/19/2014    8:36 AM  Fall Risk    Section Header. No data  exists in this row.   Falls in the past year?   No    Assessment & Plan    1. Encounter for general adult medical examination with abnormal findings Annual physical today   2. Generalized anxiety disorder Stable. Continue sertraline and wellbutrin daily. May use alprazolam 0.25 mg as needed and as prescribed.   3. BMI 32.0-32.9,adult Discussed lowering calorie intake to 1500 calories per day and incorporating exercise into daily routine to help lose weight.   4. Abnormal white blood cell Recheck labs in 1 month. Refer to hematology as indicated   5. Abnormal liver function test Recheck CMP and hepatitis series for further evaluation.       Immunization History  Administered Date(s) Administered   H1N1 10/21/2007   Hepatitis B, adult 01/14/1998, 02/16/1998, 07/22/1998   IPV 10/23/1974, 01/29/1975, 04/23/1975, 06/12/1976, 08/11/1979   Influenza,inj,Quad PF,6+ Mos 10/24/2021   MMR 11/29/1975, 06/27/1993   PFIZER(Purple Top)SARS-COV-2 Vaccination 01/17/2019, 02/06/2019, 10/23/2019   Pfizer Covid-19 Vaccine Bivalent Booster 31yr & up 10/18/2020    Health Maintenance  Topic Date Due   Hepatitis C Screening  Never done   DTaP/Tdap/Td (1 - Tdap) Never done   PAP SMEAR-Modifier  Never done   COVID-19 Vaccine (5 - 2023-24 season) 09/08/2021   COLONOSCOPY (Pts 45-470yrInsurance coverage will need to be confirmed)  05/23/2031   INFLUENZA VACCINE  Completed   HIV Screening  Completed   HPV VACCINES  Aged Out  Discussed health benefits of physical activity, and encouraged her to engage in regular exercise appropriate for her age and condition.  Problem List Items Addressed This Visit       Other   Generalized anxiety disorder   BMI 32.0-32.9,adult   Abnormal white blood cell   Abnormal liver function test   Other Visit Diagnoses     Encounter for general adult medical examination with abnormal findings    -  Primary        Return in about 3 months (around  04/06/2022) for check HgbA1c - needs labs 1 month - CBC, CMP, vit. d, lhepatitis.        Ronnell Freshwater, NP  [facility] at [facility] [phone] (phone) [phone] (fax)  [place]

## 2022-01-09 ENCOUNTER — Encounter: Payer: Self-pay | Admitting: Nurse Practitioner

## 2022-01-19 ENCOUNTER — Other Ambulatory Visit: Payer: Self-pay | Admitting: Nurse Practitioner

## 2022-01-19 DIAGNOSIS — Z0000 Encounter for general adult medical examination without abnormal findings: Secondary | ICD-10-CM

## 2022-01-25 ENCOUNTER — Other Ambulatory Visit (HOSPITAL_COMMUNITY): Payer: Self-pay

## 2022-01-25 ENCOUNTER — Other Ambulatory Visit: Payer: Self-pay | Admitting: Nurse Practitioner

## 2022-01-25 MED ORDER — SERTRALINE HCL 50 MG PO TABS
75.0000 mg | ORAL_TABLET | Freq: Every morning | ORAL | 0 refills | Status: DC
  Filled 2022-01-25: qty 135, 90d supply, fill #0

## 2022-02-04 DIAGNOSIS — D729 Disorder of white blood cells, unspecified: Secondary | ICD-10-CM | POA: Insufficient documentation

## 2022-02-04 DIAGNOSIS — R7989 Other specified abnormal findings of blood chemistry: Secondary | ICD-10-CM | POA: Insufficient documentation

## 2022-02-05 ENCOUNTER — Other Ambulatory Visit: Payer: Commercial Managed Care - PPO

## 2022-02-05 DIAGNOSIS — R7989 Other specified abnormal findings of blood chemistry: Secondary | ICD-10-CM

## 2022-02-05 DIAGNOSIS — Z0000 Encounter for general adult medical examination without abnormal findings: Secondary | ICD-10-CM

## 2022-02-06 LAB — CBC WITH DIFFERENTIAL/PLATELET
Basophils Absolute: 0 10*3/uL (ref 0.0–0.2)
Basos: 1 %
EOS (ABSOLUTE): 0.1 10*3/uL (ref 0.0–0.4)
Eos: 2 %
Hematocrit: 37.4 % (ref 34.0–46.6)
Hemoglobin: 12.5 g/dL (ref 11.1–15.9)
Immature Grans (Abs): 0 10*3/uL (ref 0.0–0.1)
Immature Granulocytes: 0 %
Lymphocytes Absolute: 1.6 10*3/uL (ref 0.7–3.1)
Lymphs: 50 %
MCH: 29.7 pg (ref 26.6–33.0)
MCHC: 33.4 g/dL (ref 31.5–35.7)
MCV: 89 fL (ref 79–97)
Monocytes Absolute: 0.3 10*3/uL (ref 0.1–0.9)
Monocytes: 9 %
Neutrophils Absolute: 1.2 10*3/uL — ABNORMAL LOW (ref 1.4–7.0)
Neutrophils: 38 %
Platelets: 138 10*3/uL — ABNORMAL LOW (ref 150–450)
RBC: 4.21 x10E6/uL (ref 3.77–5.28)
RDW: 12 % (ref 11.7–15.4)
WBC: 3.3 10*3/uL — ABNORMAL LOW (ref 3.4–10.8)

## 2022-02-06 LAB — COMPREHENSIVE METABOLIC PANEL
ALT: 103 IU/L — ABNORMAL HIGH (ref 0–32)
AST: 46 IU/L — ABNORMAL HIGH (ref 0–40)
Albumin/Globulin Ratio: 1.9 (ref 1.2–2.2)
Albumin: 4.3 g/dL (ref 3.9–4.9)
Alkaline Phosphatase: 85 IU/L (ref 44–121)
BUN/Creatinine Ratio: 20 (ref 9–23)
BUN: 17 mg/dL (ref 6–24)
Bilirubin Total: 0.4 mg/dL (ref 0.0–1.2)
CO2: 24 mmol/L (ref 20–29)
Calcium: 8.9 mg/dL (ref 8.7–10.2)
Chloride: 103 mmol/L (ref 96–106)
Creatinine, Ser: 0.83 mg/dL (ref 0.57–1.00)
Globulin, Total: 2.3 g/dL (ref 1.5–4.5)
Glucose: 113 mg/dL — ABNORMAL HIGH (ref 70–99)
Potassium: 5 mmol/L (ref 3.5–5.2)
Sodium: 139 mmol/L (ref 134–144)
Total Protein: 6.6 g/dL (ref 6.0–8.5)
eGFR: 87 mL/min/{1.73_m2} (ref >59)

## 2022-02-06 LAB — ACUTE VIRAL HEPATITIS (HAV, HBV, HCV)
HCV Ab: NONREACTIVE
Hep A IgM: NEGATIVE
Hep B C IgM: NEGATIVE
Hepatitis B Surface Ag: NEGATIVE

## 2022-02-06 LAB — HEPATITIS A ANTIBODY, TOTAL: hep A Total Ab: POSITIVE — AB

## 2022-02-06 LAB — HCV INTERPRETATION

## 2022-02-06 LAB — VITAMIN D 25 HYDROXY (VIT D DEFICIENCY, FRACTURES): Vit D, 25-Hydroxy: 45.9 ng/mL (ref 30.0–100.0)

## 2022-02-06 NOTE — Progress Notes (Signed)
Hey Dr. Madilyn Fireman. I am confused by these results. The patient has had elevated liver functions. The Hep A Ab is positive with the Hep A igM negative. Do I need to send her to ID for immunoglobulin?

## 2022-04-09 ENCOUNTER — Ambulatory Visit: Payer: Commercial Managed Care - PPO | Admitting: Nurse Practitioner

## 2022-04-16 ENCOUNTER — Other Ambulatory Visit: Payer: Self-pay

## 2022-04-16 ENCOUNTER — Ambulatory Visit (INDEPENDENT_AMBULATORY_CARE_PROVIDER_SITE_OTHER): Payer: Commercial Managed Care - PPO | Admitting: Obstetrics & Gynecology

## 2022-04-16 ENCOUNTER — Encounter: Payer: Self-pay | Admitting: Obstetrics & Gynecology

## 2022-04-16 VITALS — BP 130/89 | HR 64 | Wt 213.1 lb

## 2022-04-16 DIAGNOSIS — N951 Menopausal and female climacteric states: Secondary | ICD-10-CM | POA: Diagnosis not present

## 2022-04-16 NOTE — Progress Notes (Signed)
   GYNECOLOGY OFFICE VISIT NOTE  History:   [name] is a 47 y.o. [phone] here today for discussion about perimenopuasal symptoms. LMP 12/2021, had two periods last year. Only one week of hot flashes. No night sweats or mood swings, but she is on mood stabilizers.  Reports low libido, wants intervention, wants intervention for this.  She denies any abnormal vaginal discharge, bleeding, pelvic pain or other concerns.    Past Medical History:  Diagnosis Date   H/O dilation and curettage 01/08/2013   Hx of varicella    Melanoma 01/09/2011   Seasonal allergies     Past Surgical History:  Procedure Laterality Date   DILATION AND CURETTAGE OF UTERUS     SKIN SURGERY     The following portions of the patient's history were reviewed and updated as appropriate: allergies, current medications, past family history, past medical history, past social history, past surgical history and problem list.   Health Maintenance:  Normal pap and negative HRHPV in 2022.Marland Kitchen  Normal mammogram on 11/27/2021.   Review of Systems:  Pertinent items noted in HPI and remainder of comprehensive ROS otherwise negative.  Physical Exam:  BP 130/89   Pulse 64   Wt 213 lb 1.6 oz (96.7 kg)   LMP 12/23/2021   BMI 31.47 kg/m  CONSTITUTIONAL: Well-developed, well-nourished female in no acute distress.  HEENT:  Normocephalic, atraumatic. External right and left ear normal. No scleral icterus.  NECK: Normal range of motion, supple, no masses noted on observation SKIN: No rash noted. Not diaphoretic. No erythema. No pallor. MUSCULOSKELETAL: Normal range of motion. No edema noted. NEUROLOGIC: Alert and oriented to person, place, and time. Normal muscle tone coordination. No cranial nerve deficit noted. PSYCHIATRIC: Normal mood and affect. Normal behavior. Normal judgment and thought content. CARDIOVASCULAR: Normal heart rate noted RESPIRATORY: Effort and breath sounds normal, no problems with respiration  noted ABDOMEN: No masses noted. No other overt distention noted.   PELVIC: Deferred    Assessment and Plan:     1. Perimenopause symptoms Discussed multifactorial nature of female libido.  Talked about management strategies: Addyi, hormone therapy with  Estrogen - Progesterone pills or patches, SCREAM creams (compounding pharmacies) and sex therapy.  Patient will research these options and let me know via MyChart what she wants to do.  No other concerns for now.   Routine preventative health maintenance measures emphasized. Please refer to After Visit Summary for other counseling recommendations.   Return for any gynecologic concerns.    I spent 30 minutes dedicated to the care of this patient including pre-visit review of records, face to face time with the patient discussing her conditions and treatments and post visit orders.    Jaynie Collins, MD, FACOG Obstetrician & Gynecologist, [facility] for [employer], [facility]

## 2022-04-16 NOTE — Patient Instructions (Addendum)
Addyi  Estrogen - Progesterone pills or patches  SCREAM creams (compounding pharmacies)  Sex therapy

## 2022-04-17 NOTE — Progress Notes (Unsigned)
Established patient visit   Patient: [name]   DOB: 1975/06/15   47 y.o. Female  MRN: [id number] Visit Date: 04/18/2022   No chief complaint on file.  Subjective    HPI  ***   Medications: Outpatient Medications Prior to Visit  Medication Sig   albuterol (VENTOLIN HFA) 108 (90 Base) MCG/ACT inhaler INHALE 2 PUFFS BY MOUTH INTO THE LUNGS EVERY 4 HOURS AS NEEDED   ALPRAZolam (XANAX) 0.25 MG tablet Take 1-2  tablets at bedtime, and up to 3 times a day when needed for panic attacks   buPROPion (WELLBUTRIN XL) 150 MG 24 hr tablet Take 1 tablet (150 mg total) by mouth every morning.   fluticasone (FLONASE) 50 MCG/ACT nasal spray Place into both nostrils daily.   loratadine (CLARITIN) 10 MG tablet Take 10 mg by mouth daily.   Multiple Vitamin (MULTI VITAMIN DAILY PO) Take by mouth.   sertraline (ZOLOFT) 50 MG tablet Take 1.5 tablets (75 mg total) by mouth in the morning.   No facility-administered medications prior to visit.    Review of Systems  Last CBC Lab Results  Component Value Date   WBC 3.3 (L) 02/05/2022   HGB 12.5 02/05/2022   HCT 37.4 02/05/2022   MCV 89 02/05/2022   MCH 29.7 02/05/2022   RDW 12.0 02/05/2022   PLT 138 (L) 02/05/2022   Last metabolic panel Lab Results  Component Value Date   GLUCOSE 113 (H) 02/05/2022   NA 139 02/05/2022   K 5.0 02/05/2022   CL 103 02/05/2022   CO2 24 02/05/2022   BUN 17 02/05/2022   CREATININE 0.83 02/05/2022   EGFR 87 02/05/2022   CALCIUM 8.9 02/05/2022   PROT 6.6 02/05/2022   ALBUMIN 4.3 02/05/2022   LABGLOB 2.3 02/05/2022   AGRATIO 1.9 02/05/2022   BILITOT 0.4 02/05/2022   ALKPHOS 85 02/05/2022   AST 46 (H) 02/05/2022   ALT 103 (H) 02/05/2022   Last lipids Lab Results  Component Value Date   CHOL 140 12/29/2021   HDL 44 12/29/2021   LDLCALC 79 12/29/2021   TRIG 92 12/29/2021   CHOLHDL 3.2 12/29/2021   Last hemoglobin A1c Lab Results  Component Value Date   HGBA1C 6.0 (H) 12/29/2021    Last thyroid functions Lab Results  Component Value Date   TSH 3.810 12/29/2021   Last vitamin D Lab Results  Component Value Date   VD25OH 45.9 02/05/2022   Last vitamin B12 and Folate No results found for: "VITAMINB12", "FOLATE"     Objective    There were no vitals filed for this visit. There is no height or weight on file to calculate BMI.  BP Readings from Last 3 Encounters:  04/16/22 130/89  01/05/22 119/85  10/24/21 121/87    Wt Readings from Last 3 Encounters:  04/16/22 213 lb 1.6 oz (96.7 kg)  01/05/22 218 lb 6.4 oz (99.1 kg)  10/24/21 217 lb (98.4 kg)    Physical Exam  ***  No results found for any visits on 04/18/22.  Assessment & Plan    There are no diagnoses linked to this encounter.   No follow-ups on file.         Carlean Jews, NP  [facility] at [facility] [phone] (phone) [phone] (fax)  [facility]

## 2022-04-18 ENCOUNTER — Other Ambulatory Visit: Payer: Self-pay

## 2022-04-18 ENCOUNTER — Ambulatory Visit (INDEPENDENT_AMBULATORY_CARE_PROVIDER_SITE_OTHER): Payer: Commercial Managed Care - PPO | Admitting: Nurse Practitioner

## 2022-04-18 ENCOUNTER — Encounter: Payer: Self-pay | Admitting: Nurse Practitioner

## 2022-04-18 ENCOUNTER — Other Ambulatory Visit (HOSPITAL_COMMUNITY): Payer: Self-pay

## 2022-04-18 VITALS — BP 118/70 | HR 70 | Ht 69.0 in | Wt 212.4 lb

## 2022-04-18 DIAGNOSIS — Z6831 Body mass index (BMI) 31.0-31.9, adult: Secondary | ICD-10-CM | POA: Diagnosis not present

## 2022-04-18 DIAGNOSIS — F411 Generalized anxiety disorder: Secondary | ICD-10-CM | POA: Diagnosis not present

## 2022-04-18 DIAGNOSIS — R7301 Impaired fasting glucose: Secondary | ICD-10-CM

## 2022-04-18 DIAGNOSIS — D72819 Decreased white blood cell count, unspecified: Secondary | ICD-10-CM | POA: Diagnosis not present

## 2022-04-18 DIAGNOSIS — R7989 Other specified abnormal findings of blood chemistry: Secondary | ICD-10-CM

## 2022-04-18 HISTORY — DX: Decreased white blood cell count, unspecified: D72.819

## 2022-04-18 LAB — POCT GLYCOSYLATED HEMOGLOBIN (HGB A1C): HbA1c POC (<> result, manual entry): 5.4 % (ref 4.0–5.6)

## 2022-04-18 MED ORDER — SERTRALINE HCL 50 MG PO TABS
75.0000 mg | ORAL_TABLET | Freq: Every morning | ORAL | 1 refills | Status: DC
  Filled 2022-04-18: qty 135, 90d supply, fill #0
  Filled 2022-07-19: qty 135, 90d supply, fill #1

## 2022-04-18 MED ORDER — ALPRAZOLAM 0.25 MG PO TABS
0.2500 mg | ORAL_TABLET | Freq: Three times a day (TID) | ORAL | 3 refills | Status: DC
  Filled 2022-04-18: qty 45, 8d supply, fill #0
  Filled 2022-07-19: qty 45, 8d supply, fill #1
  Filled 2022-08-21: qty 45, 8d supply, fill #2

## 2022-04-18 NOTE — Assessment & Plan Note (Signed)
Improved with HgbA1c 5.4 today. Will continue to monitor

## 2022-04-18 NOTE — Assessment & Plan Note (Deleted)
Recheck CBC today. 

## 2022-04-18 NOTE — Assessment & Plan Note (Signed)
Recheck CBC today. 

## 2022-04-18 NOTE — Assessment & Plan Note (Signed)
Continue with low calorie, high-protein diet. Incorporate exercise into daily activities.

## 2022-04-18 NOTE — Assessment & Plan Note (Signed)
Hepatitis screening negative. Recheck CMP with LFTs today

## 2022-04-19 LAB — COMPREHENSIVE METABOLIC PANEL
ALT: 58 IU/L — ABNORMAL HIGH (ref 0–32)
AST: 29 IU/L (ref 0–40)
Albumin/Globulin Ratio: 2 (ref 1.2–2.2)
Albumin: 4.6 g/dL (ref 3.9–4.9)
Alkaline Phosphatase: 65 IU/L (ref 44–121)
BUN/Creatinine Ratio: 20 (ref 9–23)
BUN: 15 mg/dL (ref 6–24)
Bilirubin Total: 0.3 mg/dL (ref 0.0–1.2)
CO2: 21 mmol/L (ref 20–29)
Calcium: 9.2 mg/dL (ref 8.7–10.2)
Chloride: 102 mmol/L (ref 96–106)
Creatinine, Ser: 0.76 mg/dL (ref 0.57–1.00)
Globulin, Total: 2.3 g/dL (ref 1.5–4.5)
Glucose: 100 mg/dL — ABNORMAL HIGH (ref 70–99)
Potassium: 4.5 mmol/L (ref 3.5–5.2)
Sodium: 137 mmol/L (ref 134–144)
Total Protein: 6.9 g/dL (ref 6.0–8.5)
eGFR: 97 mL/min/{1.73_m2} (ref >59)

## 2022-04-19 LAB — CBC
Hematocrit: 41 % (ref 34.0–46.6)
Hemoglobin: 14.3 g/dL (ref 11.1–15.9)
MCH: 30.2 pg (ref 26.6–33.0)
MCHC: 34.9 g/dL (ref 31.5–35.7)
MCV: 87 fL (ref 79–97)
Platelets: 170 10*3/uL (ref 150–450)
RBC: 4.74 x10E6/uL (ref 3.77–5.28)
RDW: 12 % (ref 11.7–15.4)
WBC: 4 10*3/uL (ref 3.4–10.8)

## 2022-05-28 ENCOUNTER — Other Ambulatory Visit (HOSPITAL_COMMUNITY): Payer: Self-pay

## 2022-07-09 NOTE — Progress Notes (Signed)
LFTs much improved.  Will continue to monitor.

## 2022-07-19 ENCOUNTER — Other Ambulatory Visit: Payer: Self-pay

## 2022-08-21 ENCOUNTER — Ambulatory Visit: Payer: Commercial Managed Care - PPO | Admitting: Nurse Practitioner

## 2022-08-21 ENCOUNTER — Other Ambulatory Visit: Payer: Self-pay

## 2022-08-28 ENCOUNTER — Ambulatory Visit: Payer: Commercial Managed Care - PPO | Admitting: Family Medicine

## 2022-08-28 ENCOUNTER — Other Ambulatory Visit (HOSPITAL_COMMUNITY): Payer: Self-pay

## 2022-08-28 ENCOUNTER — Encounter: Payer: Self-pay | Admitting: Family Medicine

## 2022-08-28 VITALS — BP 128/84 | HR 72 | Resp 18 | Ht 69.0 in | Wt 218.0 lb

## 2022-08-28 DIAGNOSIS — F411 Generalized anxiety disorder: Secondary | ICD-10-CM | POA: Diagnosis not present

## 2022-08-28 DIAGNOSIS — D2271 Melanocytic nevi of right lower limb, including hip: Secondary | ICD-10-CM | POA: Diagnosis not present

## 2022-08-28 DIAGNOSIS — L813 Cafe au lait spots: Secondary | ICD-10-CM | POA: Diagnosis not present

## 2022-08-28 DIAGNOSIS — L718 Other rosacea: Secondary | ICD-10-CM | POA: Diagnosis not present

## 2022-08-28 DIAGNOSIS — L814 Other melanin hyperpigmentation: Secondary | ICD-10-CM | POA: Diagnosis not present

## 2022-08-28 DIAGNOSIS — D2272 Melanocytic nevi of left lower limb, including hip: Secondary | ICD-10-CM | POA: Diagnosis not present

## 2022-08-28 DIAGNOSIS — D2262 Melanocytic nevi of left upper limb, including shoulder: Secondary | ICD-10-CM | POA: Diagnosis not present

## 2022-08-28 DIAGNOSIS — D2261 Melanocytic nevi of right upper limb, including shoulder: Secondary | ICD-10-CM | POA: Diagnosis not present

## 2022-08-28 DIAGNOSIS — D225 Melanocytic nevi of trunk: Secondary | ICD-10-CM | POA: Diagnosis not present

## 2022-08-28 DIAGNOSIS — Z85820 Personal history of malignant melanoma of skin: Secondary | ICD-10-CM | POA: Diagnosis not present

## 2022-08-28 MED ORDER — BUPROPION HCL ER (XL) 150 MG PO TB24
150.0000 mg | ORAL_TABLET | Freq: Every morning | ORAL | 3 refills | Status: DC
Start: 2022-08-28 — End: 2023-01-16
  Filled 2022-08-28 – 2022-11-23 (×2): qty 90, 90d supply, fill #0

## 2022-08-28 MED ORDER — SERTRALINE HCL 100 MG PO TABS
100.0000 mg | ORAL_TABLET | Freq: Every day | ORAL | 1 refills | Status: DC
  Filled 2022-08-28: qty 90, 90d supply, fill #0
  Filled 2022-11-23: qty 90, 90d supply, fill #1

## 2022-08-28 NOTE — Assessment & Plan Note (Signed)
With worsening anxiety, we discussed options including waiting it out, increasing Zoloft dose, or adding buspirone.  She would like to keep her regimen as simple as possible and is open to increasing the Zoloft dose.  Continue Wellbutrin 150 mg daily, increase to sertraline 100 mg daily.  She can also use Xanax 0.25 mg tablets as rescue medication for breakthrough anxiety.  We will follow-up in about 8 weeks to ensure that this is an effective regimen for her.

## 2022-08-28 NOTE — Progress Notes (Signed)
Established Patient Office Visit  Subjective   Patient ID: [name], female    DOB: Mar 27, 1975  Age: 47 y.o. MRN: [id number]  Chief Complaint  Patient presents with   Anxiety   Depression    HPI Nylie Sorenson is a 47 y.o. female presenting today for follow up of mood. Mood: Patient is here to follow up for depression and anxiety, currently managing with Zoloft and Wellbutrin. Taking medication without side effects, reports excellent compliance with treatment. Denies mood changes or SI/HI. She feels mood is worse since last visit.  She is still waiting to hear if she has a job this upcoming month and feels that her increased anxiety is likely situational but it has been difficult to deal with.     08/28/2022    9:04 AM 04/18/2022    8:38 AM 04/16/2022   11:04 AM  Depression screen PHQ 2/9  Decreased Interest 0 0 0  Down, Depressed, Hopeless 1 0 0  PHQ - 2 Score 1 0 0  Altered sleeping 1 0 0  Tired, decreased energy 0 1 1  Change in appetite 2 1 1   Feeling bad or failure about yourself  1 1 1   Trouble concentrating 1 0 1  Moving slowly or fidgety/restless 0 0 0  Suicidal thoughts 0 0 0  PHQ-9 Score 6 3 4   Difficult doing work/chores Somewhat difficult         08/28/2022    9:04 AM 04/16/2022   11:04 AM 01/05/2022   10:27 AM 10/24/2021    3:50 PM  GAD 7 : Generalized Anxiety Score  Nervous, Anxious, on Edge 2 1 1 1   Control/stop worrying 2 1 1 1   Worry too much - different things 2 1 1 1   Trouble relaxing 2 1 1 1   Restless 2 0 0 0  Easily annoyed or irritable 3 1 1 1   Afraid - awful might happen 2 0 0 0  Total GAD 7 Score 15 5 5 5   Anxiety Difficulty Somewhat difficult      ROS Negative unless otherwise noted in HPI   Objective:     BP 128/84 (BP Location: Left Arm, Patient Position: Sitting, Cuff Size: Normal)   Pulse 72   Resp 18   Ht 5\' 9"  (1.753 m)   Wt 218 lb (98.9 kg)   SpO2 95%   BMI 32.19 kg/m   Physical Exam Constitutional:       General: She is not in acute distress.    Appearance: Normal appearance.  HENT:     Head: Normocephalic and atraumatic.  Cardiovascular:     Rate and Rhythm: Normal rate and regular rhythm.     Heart sounds: No murmur heard.    No friction rub. No gallop.  Pulmonary:     Effort: Pulmonary effort is normal. No respiratory distress.     Breath sounds: No wheezing, rhonchi or rales.  Musculoskeletal:     Cervical back: Normal range of motion.  Skin:    General: Skin is warm and dry.  Neurological:     General: No focal deficit present.     Mental Status: She is alert and oriented to person, place, and time. Mental status is at baseline.  Psychiatric:        Mood and Affect: Mood normal.        Thought Content: Thought content normal.        Judgment: Judgment normal.      Assessment &  Plan:  Generalized anxiety disorder Assessment & Plan: With worsening anxiety, we discussed options including waiting it out, increasing Zoloft dose, or adding buspirone.  She would like to keep her regimen as simple as possible and is open to increasing the Zoloft dose.  Continue Wellbutrin 150 mg daily, increase to sertraline 100 mg daily.  She can also use Xanax 0.25 mg tablets as rescue medication for breakthrough anxiety.  We will follow-up in about 8 weeks to ensure that this is an effective regimen for her.  Orders: -     Sertraline HCl; Take 1 tablet (100 mg total) by mouth daily.  Dispense: 90 tablet; Refill: 1 -     buPROPion HCl ER (XL); Take 1 tablet (150 mg total) by mouth every morning.  Dispense: 90 tablet; Refill: 3    Return in about 2 months (around 10/28/2022) for follow-up for mood, added Wellbutrin (in person or video).    Melida Quitter, PA

## 2022-09-21 ENCOUNTER — Telehealth: Payer: Commercial Managed Care - PPO | Admitting: Nurse Practitioner

## 2022-09-21 DIAGNOSIS — J069 Acute upper respiratory infection, unspecified: Secondary | ICD-10-CM

## 2022-09-21 NOTE — Progress Notes (Signed)
    We noted that you selected an out of state pharmacy. If you are currently traveling outside of [place] or [place] we are unable to treat you at this time.  Please confirm your location, and if you need services while out of state you can use: [facility] ([facility]) that is a telehealth service for the entire [place]  Thank you [name]

## 2022-10-15 ENCOUNTER — Other Ambulatory Visit (HOSPITAL_COMMUNITY): Payer: Self-pay

## 2022-10-15 ENCOUNTER — Telehealth: Payer: Commercial Managed Care - PPO | Admitting: Physician Assistant

## 2022-10-15 DIAGNOSIS — J208 Acute bronchitis due to other specified organisms: Secondary | ICD-10-CM

## 2022-10-15 DIAGNOSIS — B9689 Other specified bacterial agents as the cause of diseases classified elsewhere: Secondary | ICD-10-CM

## 2022-10-15 MED ORDER — AZITHROMYCIN 250 MG PO TABS
ORAL_TABLET | ORAL | 0 refills | Status: AC
Start: 2022-10-15 — End: 2022-10-20
  Filled 2022-10-15: qty 6, 5d supply, fill #0

## 2022-10-15 MED ORDER — BENZONATATE 100 MG PO CAPS
100.0000 mg | ORAL_CAPSULE | Freq: Three times a day (TID) | ORAL | 0 refills | Status: DC | PRN
Start: 2022-10-15 — End: 2023-01-16
  Filled 2022-10-15: qty 30, 5d supply, fill #0

## 2022-10-15 NOTE — Progress Notes (Signed)

## 2022-10-30 ENCOUNTER — Encounter: Payer: Self-pay | Admitting: Family Medicine

## 2022-10-30 ENCOUNTER — Ambulatory Visit
Admission: RE | Admit: 2022-10-30 | Discharge: 2022-10-30 | Disposition: A | Discharge status: Home / Self Care | Payer: BC Managed Care – PPO | Source: Ambulatory Visit | Attending: Family Medicine | Admitting: Family Medicine

## 2022-10-30 ENCOUNTER — Ambulatory Visit (INDEPENDENT_AMBULATORY_CARE_PROVIDER_SITE_OTHER): Payer: BC Managed Care – PPO | Admitting: Family Medicine

## 2022-10-30 VITALS — BP 115/79 | HR 67 | Resp 18 | Ht 69.0 in | Wt 219.0 lb

## 2022-10-30 DIAGNOSIS — F411 Generalized anxiety disorder: Secondary | ICD-10-CM

## 2022-10-30 DIAGNOSIS — R058 Other specified cough: Secondary | ICD-10-CM

## 2022-10-30 NOTE — Patient Instructions (Signed)
The order for the chest x-ray has been sent to [facility] imaging.  You can go as a walk-in appointment to have that done either today or in the next couple of days.  Once I get those results back, I will let you know what it shows and what we should do moving forward.  For now, continue using Tessalon Perles only at night to help you sleep and staying hydrated throughout the day.

## 2022-10-30 NOTE — Assessment & Plan Note (Signed)
PHQ-9 score 6, GAD-7 score 6.  Continue Zoloft 100 mg daily, Wellbutrin 150 mg daily, and Xanax rescue medication.  Follow-up in 2 months to assess mood after she has recovered from illness.

## 2022-10-30 NOTE — Progress Notes (Signed)
Established Patient Office Visit  Subjective   Patient ID: [name], female    DOB: 1975-07-01  Age: 47 y.o. MRN: [id number]  Chief Complaint  Patient presents with   Anxiety   Depression   Cough    Production of thick mucus ranging from pink to green    HPI [name] is a 47 y.o. female presenting today for follow up of mood.  She also notes that she continues to have cough with sputum that ranges from pink to green in color.  She has also been hoarse.  Initially presented to urgent care on 09/21/2022 with complaints of rhinorrhea, cough, postnasal drip [place]  Had a virtual visit on 10/15/2022 with continued productive cough, sore throat, wheezing, sinus pressure, sore throat.  Prescribed 4-day course of azithromycin, Mucinex DM, and Tessalon Perles. Mood: Patient is here to follow up for anxiety, currently managing with Zoloft 100 mg daily and bupropion 150 mg daily.  Also has Xanax 0.25 mg as rescue medication. Taking medication without side effects, reports excellent compliance with treatment. Denies mood changes or SI/HI. She feels mood is worse since last visit, but she does not know if that is because of a problem with her current medication routine or if it is related to the current situation of being sick for so many weeks now.     10/30/2022   11:08 AM 08/28/2022    9:04 AM 04/18/2022    8:38 AM  Depression screen PHQ 2/9  Decreased Interest 1 0 0  Down, Depressed, Hopeless 1 1 0  PHQ - 2 Score 2 1 0  Altered sleeping 1 1 0  Tired, decreased energy 1 0 1  Change in appetite 1 2 1   Feeling bad or failure about yourself  1 1 1   Trouble concentrating 0 1 0  Moving slowly or fidgety/restless 0 0 0  Suicidal thoughts 0 0 0  PHQ-9 Score 6 6 3   Difficult doing work/chores Somewhat difficult Somewhat difficult        10/30/2022   11:09 AM 08/28/2022    9:04 AM 04/16/2022   11:04 AM 01/05/2022   10:27 AM  GAD 7 : Generalized Anxiety  Score  Nervous, Anxious, on Edge 2 2 1 1   Control/stop worrying 1 2 1 1   Worry too much - different things 1 2 1 1   Trouble relaxing 1 2 1 1   Restless 0 2 0 0  Easily annoyed or irritable 1 3 1 1   Afraid - awful might happen 0 2 0 0  Total GAD 7 Score 6 15 5 5   Anxiety Difficulty Somewhat difficult Somewhat difficult     Outpatient Medications Prior to Visit  Medication Sig   ALPRAZolam (XANAX) 0.25 MG tablet Take 1-2  tablets at bedtime, and up to 3 times a day when needed for panic attacks   benzonatate (TESSALON) 100 MG capsule Take 1-2 capsules (100-200 mg total) by mouth 3 (three) times daily as needed.   buPROPion (WELLBUTRIN XL) 150 MG 24 hr tablet Take 1 tablet (150 mg total) by mouth every morning.   fluticasone (FLONASE) 50 MCG/ACT nasal spray Place into both nostrils daily.   loratadine (CLARITIN) 10 MG tablet Take 10 mg by mouth daily.   Multiple Vitamin (MULTI VITAMIN DAILY PO) Take by mouth.   sertraline (ZOLOFT) 100 MG tablet Take 1 tablet (100 mg total) by mouth daily.   albuterol (VENTOLIN HFA) 108 (90 Base) MCG/ACT inhaler INHALE  2 PUFFS BY MOUTH INTO THE LUNGS EVERY 4 HOURS AS NEEDED   No facility-administered medications prior to visit.    ROS Negative unless otherwise noted in HPI   Objective:     BP 115/79 (BP Location: Left Arm, Patient Position: Sitting, Cuff Size: Normal)   Pulse 67   Resp 18   Ht 5\' 9"  (1.753 m)   Wt 219 lb (99.3 kg)   SpO2 95%   BMI 32.34 kg/m   Physical Exam Constitutional:      General: She is not in acute distress.    Appearance: Normal appearance. She is not ill-appearing.  HENT:     Head: Normocephalic and atraumatic.     Right Ear: Tympanic membrane, ear canal and external ear normal. There is no impacted cerumen.     Left Ear: Tympanic membrane, ear canal and external ear normal. There is no impacted cerumen.     Nose: Nose normal. No congestion or rhinorrhea.     Mouth/Throat:     Mouth: Mucous membranes are moist.      Pharynx: Oropharynx is clear. No oropharyngeal exudate or posterior oropharyngeal erythema.  Eyes:     General:        Right eye: No discharge.        Left eye: No discharge.     Extraocular Movements: Extraocular movements intact.     Conjunctiva/sclera: Conjunctivae normal.     Pupils: Pupils are equal, round, and reactive to light.  Cardiovascular:     Rate and Rhythm: Normal rate and regular rhythm.     Heart sounds: No murmur heard.    No friction rub. No gallop.  Pulmonary:     Effort: Pulmonary effort is normal. No respiratory distress.     Breath sounds: Normal breath sounds. No wheezing, rhonchi or rales.  Skin:    General: Skin is warm and dry.  Neurological:     Mental Status: She is alert and oriented to person, place, and time.     Assessment & Plan:  Generalized anxiety disorder Assessment & Plan: PHQ-9 score 6, GAD-7 score 6.  Continue Zoloft 100 mg daily, Wellbutrin 150 mg daily, and Xanax rescue medication.  Follow-up in 2 months to assess mood after she has recovered from illness.   Productive cough -     DG Chest 2 View; Future  Given continued symptoms and productive cough with green or pink sputum, ordering chest x-ray.  Vital signs normal, patient maintains that she has been afebrile since symptoms started.  If chest x-ray negative for pneumonia, recommend Mucinex with increased hydration to clear mucus and to continue benzonatate at night to help sleep.  Return in about 2 months (around 12/30/2022) for follow-up for mood.    Melida Quitter, PA

## 2022-10-31 ENCOUNTER — Encounter: Payer: Self-pay | Admitting: Family Medicine

## 2022-11-02 ENCOUNTER — Encounter: Payer: Self-pay | Admitting: Family Medicine

## 2022-11-23 ENCOUNTER — Other Ambulatory Visit (HOSPITAL_COMMUNITY): Payer: Self-pay

## 2022-12-31 ENCOUNTER — Ambulatory Visit: Payer: BC Managed Care – PPO | Admitting: Family Medicine

## 2023-01-16 ENCOUNTER — Ambulatory Visit: Payer: 59 | Admitting: Family Medicine

## 2023-01-16 ENCOUNTER — Other Ambulatory Visit (HOSPITAL_COMMUNITY): Payer: Self-pay

## 2023-01-16 VITALS — BP 119/84 | HR 70 | Temp 98.0°F | Ht 69.0 in | Wt 221.0 lb

## 2023-01-16 DIAGNOSIS — Z23 Encounter for immunization: Secondary | ICD-10-CM

## 2023-01-16 DIAGNOSIS — F411 Generalized anxiety disorder: Secondary | ICD-10-CM

## 2023-01-16 MED ORDER — BUPROPION HCL ER (XL) 150 MG PO TB24
150.0000 mg | ORAL_TABLET | Freq: Every morning | ORAL | 1 refills | Status: DC
Start: 2023-01-16 — End: 2023-08-20
  Filled 2023-01-16 – 2023-02-18 (×2): qty 90, 90d supply, fill #0
  Filled 2023-05-18: qty 90, 90d supply, fill #1

## 2023-01-16 MED ORDER — SERTRALINE HCL 100 MG PO TABS
100.0000 mg | ORAL_TABLET | Freq: Every day | ORAL | 1 refills | Status: DC
  Filled 2023-01-16 – 2023-03-14 (×2): qty 90, 90d supply, fill #0
  Filled 2023-06-27: qty 90, 90d supply, fill #1

## 2023-01-16 MED ORDER — ALPRAZOLAM 0.25 MG PO TABS
0.2500 mg | ORAL_TABLET | ORAL | 0 refills | Status: AC | PRN
Start: 2023-01-16 — End: ?
  Filled 2023-01-16: qty 15, 15d supply, fill #0

## 2023-01-16 NOTE — Assessment & Plan Note (Addendum)
 PHQ-9 score 5, GAD-7 score 11.  Encouraged to continue with nonpharmacologic interventions like decreasing alcohol use frequency.  Continue Zoloft  100 mg daily, Wellbutrin  150 mg daily, and Xanax  rescue medication.  PDMP reviewed, no aberrancies.  Overdose risk score 310.  Refill of Xanax  0.25 mg tablets #15 sent to pharmacy for breakthrough anxiety or panic attacks.

## 2023-01-16 NOTE — Patient Instructions (Signed)
 It looks like it was back in 2023 but you had a slightly low white blood cell count.  This did normalize on your most recent lab work in April 2024.  We will recheck these levels with your blood work before your annual physical to ensure that they have maintained!

## 2023-01-16 NOTE — Progress Notes (Signed)
 Established Patient Office Visit  Subjective   Patient ID: [name], female    DOB: March 19, 1975  Age: 48 y.o. MRN: [id number]  Chief Complaint  Patient presents with   Follow Up Mood    HPI [name] is a 48 y.o. female presenting today for follow up of depression and anxiety, currently managing with Zoloft  100 mg daily, bupropion  150 mg daily. Taking medication without side effects, reports excellent compliance with treatment. Denies mood changes or SI/HI. She feels mood is fairly stable.  She is still happy with her daily regimen and does not wish to change at this time.  She does have situational stressors and knows that there will always be some sort of stress in her life, but she feels that it is manageable at this point with her current medications.  She and her husband have also decided to on weeknights which she has noticed has already improved her sleep.     01/16/2023    8:44 AM 10/30/2022   11:08 AM 08/28/2022    9:04 AM  Depression screen PHQ 2/9  Decreased Interest 0 1 0  Down, Depressed, Hopeless 1 1 1   PHQ - 2 Score 1 2 1   Altered sleeping 0 1 1  Tired, decreased energy 0 1 0  Change in appetite 1 1 2   Feeling bad or failure about yourself  2 1 1   Trouble concentrating 0 0 1  Moving slowly or fidgety/restless 1 0 0  Suicidal thoughts 0 0 0  PHQ-9 Score 5 6 6   Difficult doing work/chores  Somewhat difficult Somewhat difficult       01/16/2023    8:44 AM 10/30/2022   11:09 AM 08/28/2022    9:04 AM 04/16/2022   11:04 AM  GAD 7 : Generalized Anxiety Score  Nervous, Anxious, on Edge 2 2 2 1   Control/stop worrying 2 1 2 1   Worry too much - different things 2 1 2 1   Trouble relaxing 1 1 2 1   Restless 1 0 2 0  Easily annoyed or irritable 2 1 3 1   Afraid - awful might happen 1 0 2 0  Total GAD 7 Score 11 6 15 5   Anxiety Difficulty Somewhat difficult Somewhat difficult Somewhat difficult      Outpatient Medications Prior to Visit   Medication Sig   fluticasone (FLONASE) 50 MCG/ACT nasal spray Place into both nostrils daily.   loratadine (CLARITIN) 10 MG tablet Take 10 mg by mouth daily.   Multiple Vitamin (MULTI VITAMIN DAILY PO) Take by mouth.   [DISCONTINUED] ALPRAZolam  (XANAX ) 0.25 MG tablet Take 1-2  tablets at bedtime, and up to 3 times a day when needed for panic attacks   [DISCONTINUED] benzonatate  (TESSALON ) 100 MG capsule Take 1-2 capsules (100-200 mg total) by mouth 3 (three) times daily as needed.   [DISCONTINUED] buPROPion  (WELLBUTRIN  XL) 150 MG 24 hr tablet Take 1 tablet (150 mg total) by mouth every morning.   [DISCONTINUED] sertraline  (ZOLOFT ) 100 MG tablet Take 1 tablet (100 mg total) by mouth daily.   albuterol (VENTOLIN HFA) 108 (90 Base) MCG/ACT inhaler INHALE 2 PUFFS BY MOUTH INTO THE LUNGS EVERY 4 HOURS AS NEEDED   No facility-administered medications prior to visit.    ROS Negative unless otherwise noted in HPI   Objective:     BP 119/84   Pulse 70   Temp 98 F (36.7 C) (Oral)   Ht 5' 9 (1.753 m)   Wt 221 lb (100.2  kg)   SpO2 95%   BMI 32.64 kg/m   Physical Exam Constitutional:      General: She is not in acute distress.    Appearance: Normal appearance.  HENT:     Head: Normocephalic and atraumatic.  Pulmonary:     Effort: Pulmonary effort is normal. No respiratory distress.  Musculoskeletal:     Cervical back: Normal range of motion.  Neurological:     General: No focal deficit present.     Mental Status: She is alert and oriented to person, place, and time. Mental status is at baseline.  Psychiatric:        Mood and Affect: Mood normal.        Thought Content: Thought content normal.        Judgment: Judgment normal.     Assessment & Plan:  Generalized anxiety disorder Assessment & Plan: PHQ-9 score 5, GAD-7 score 11.  Encouraged to continue with nonpharmacologic interventions like decreasing alcohol use frequency.  Continue Zoloft  100 mg daily, Wellbutrin  150 mg  daily, and Xanax  rescue medication.  PDMP reviewed, no aberrancies.  Overdose risk score 310.  Refill of Xanax  0.25 mg tablets #15 sent to pharmacy for breakthrough anxiety or panic attacks.  Orders: -     Sertraline  HCl; Take 1 tablet (100 mg total) by mouth daily.  Dispense: 90 tablet; Refill: 1 -     ALPRAZolam ; Use on as-needed basis as rescue for breakthrough anxiety or panic attacks.  Dispense: 15 tablet; Refill: 0 -     buPROPion  HCl ER (XL); Take 1 tablet (150 mg total) by mouth every morning.  Dispense: 90 tablet; Refill: 1    Return in about 4 months (around 05/16/2023) for annual physical, fasting blood work 1 week before.    Joesph DELENA Sear, PA

## 2023-02-14 ENCOUNTER — Encounter: Payer: Self-pay | Admitting: Family Medicine

## 2023-02-18 ENCOUNTER — Other Ambulatory Visit (HOSPITAL_COMMUNITY): Payer: Self-pay

## 2023-03-15 ENCOUNTER — Other Ambulatory Visit (HOSPITAL_COMMUNITY): Payer: Self-pay

## 2023-05-02 ENCOUNTER — Telehealth: Payer: Self-pay | Admitting: *Deleted

## 2023-05-02 NOTE — Telephone Encounter (Signed)
 Copied from CRM [id number]. Topic: Appointments - Scheduling Inquiry for Clinic >> May 02, 2023 12:02 PM Tiffany B wrote: Reason for CRM: Patient would like an update on the new provider name so she can do research to determine if she's going to stay at the clinic and establish with the provider. Please call patient back.

## 2023-05-02 NOTE — Telephone Encounter (Signed)
 LVM informing pt of new providers name.

## 2023-05-09 ENCOUNTER — Other Ambulatory Visit: Payer: 59

## 2023-05-16 ENCOUNTER — Encounter: Payer: 59 | Admitting: Family Medicine

## 2023-05-20 ENCOUNTER — Other Ambulatory Visit (HOSPITAL_BASED_OUTPATIENT_CLINIC_OR_DEPARTMENT_OTHER): Payer: Self-pay

## 2023-06-05 ENCOUNTER — Other Ambulatory Visit: Payer: Self-pay | Admitting: Family Medicine

## 2023-06-05 DIAGNOSIS — Z1231 Encounter for screening mammogram for malignant neoplasm of breast: Secondary | ICD-10-CM

## 2023-06-07 ENCOUNTER — Ambulatory Visit
Admission: RE | Admit: 2023-06-07 | Discharge: 2023-06-07 | Disposition: A | Discharge status: Home / Self Care | Source: Ambulatory Visit

## 2023-06-07 DIAGNOSIS — Z1231 Encounter for screening mammogram for malignant neoplasm of breast: Secondary | ICD-10-CM

## 2023-06-19 IMAGING — MG MM DIGITAL SCREENING BILAT W/ TOMO AND CAD
8 series · 8 of 24 positions shown · non-contrast
Comparison: Previous exam(s).

CLINICAL DATA: Screening.

EXAM:
DIGITAL SCREENING BILATERAL MAMMOGRAM WITH TOMOSYNTHESIS AND CAD
TECHNIQUE: Bilateral screening digital craniocaudal and mediolateral oblique
mammograms were obtained. Bilateral screening digital breast
tomosynthesis was performed. The images were evaluated with
computer-aided detection.

[R MLO synth-2D]
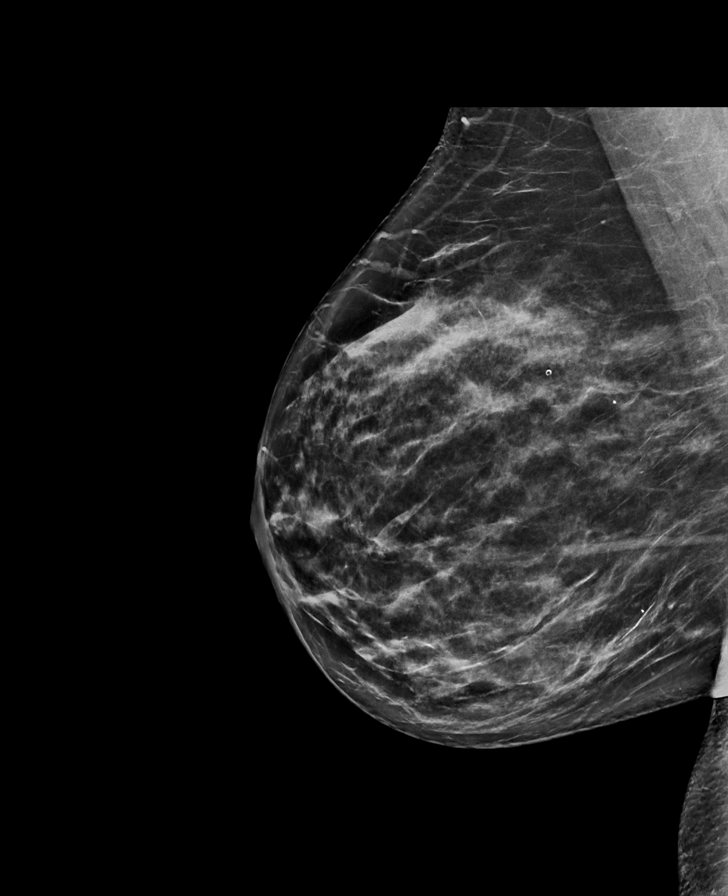

[L CC synth-2D]
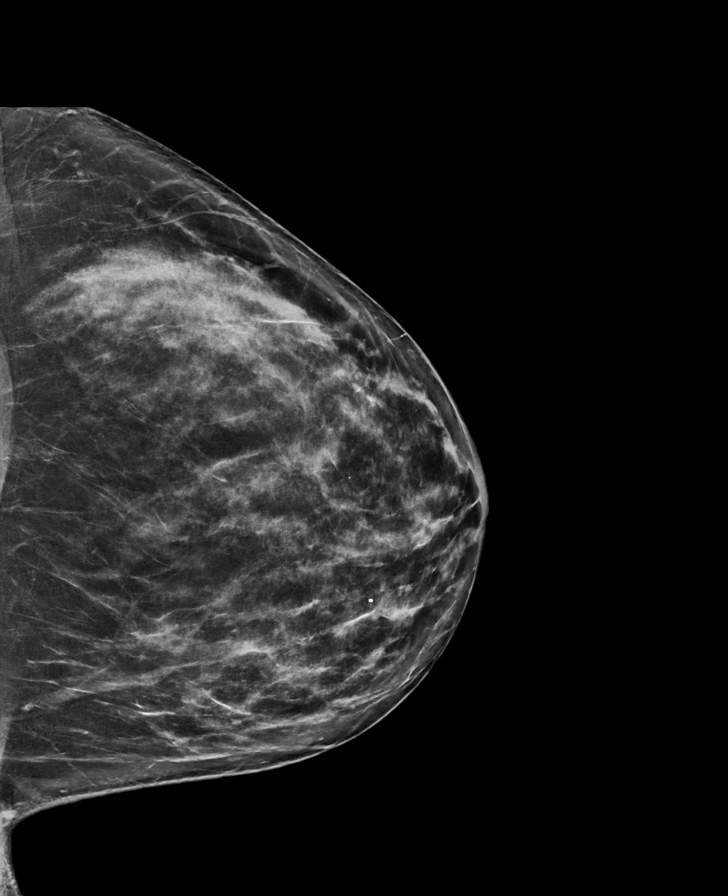

[L MLO synth-2D]
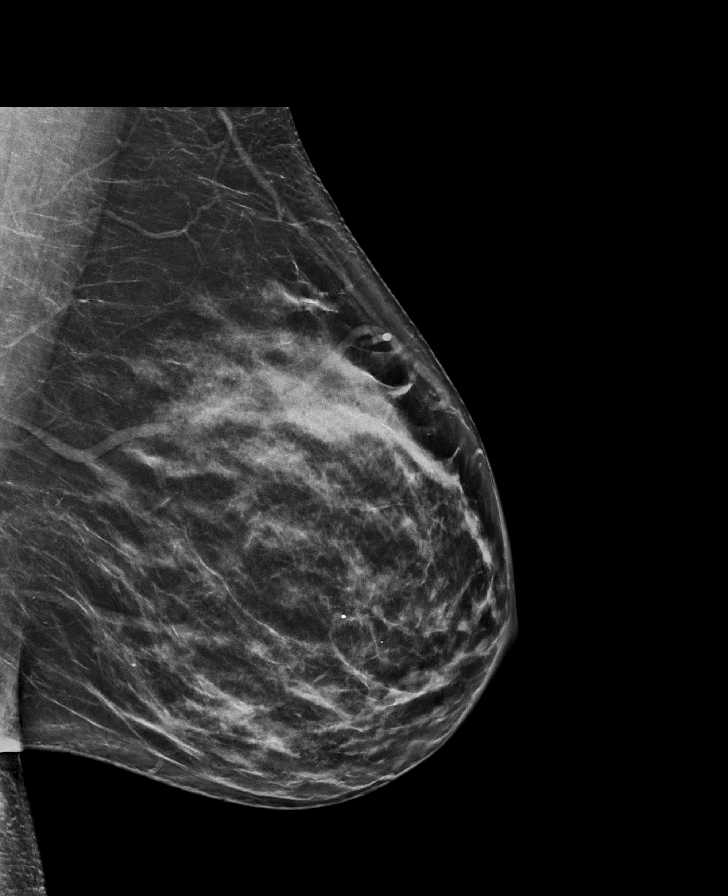

[R CC synth-2D]
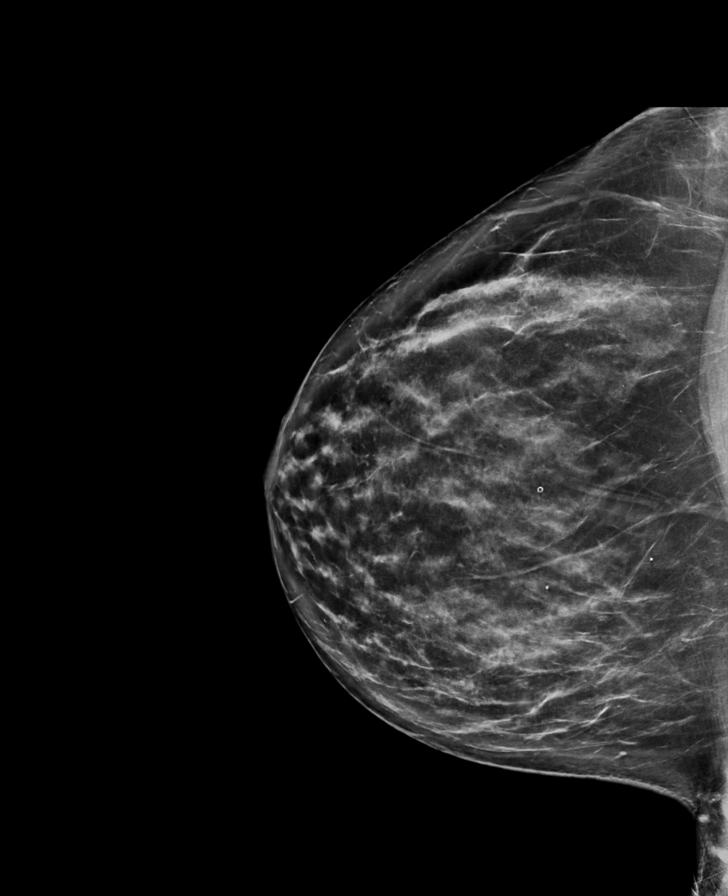

[R MLO tomo · tomo slice 41/81.0]
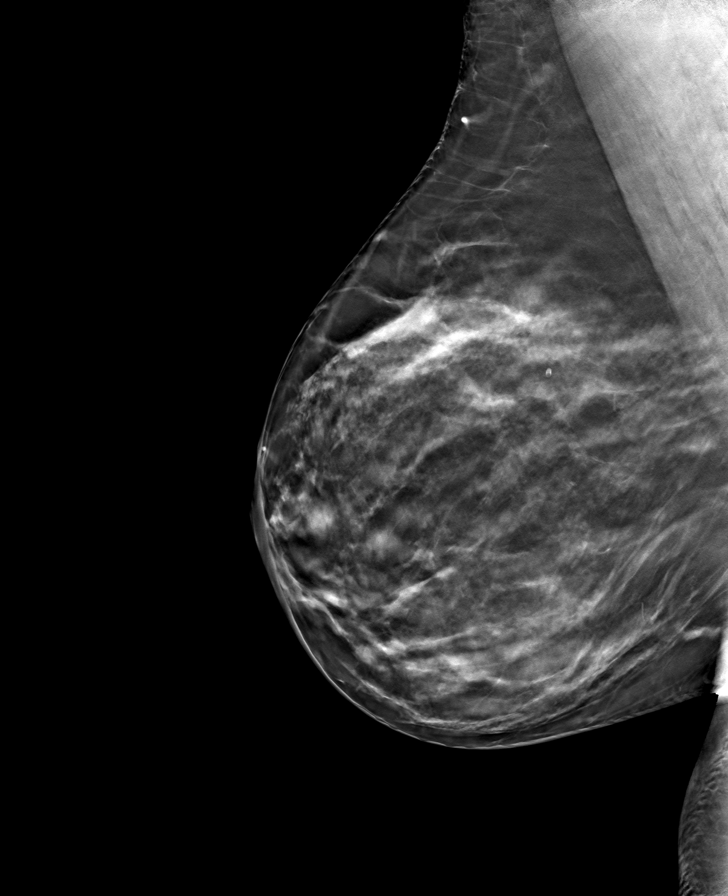

[L MLO tomo · tomo slice 41/81.0]
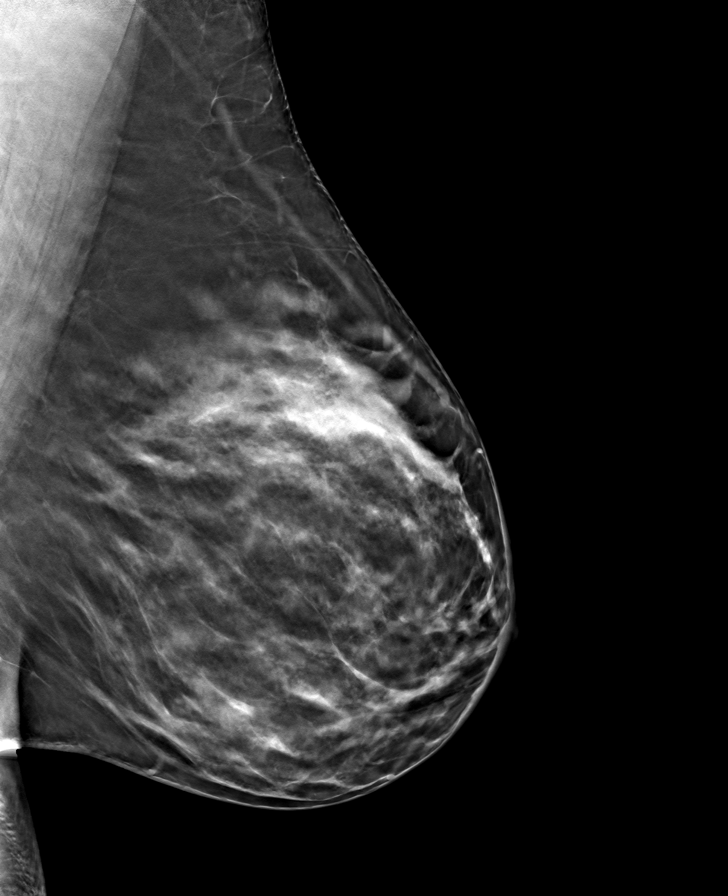

[L CC tomo · tomo slice 43/84.0]
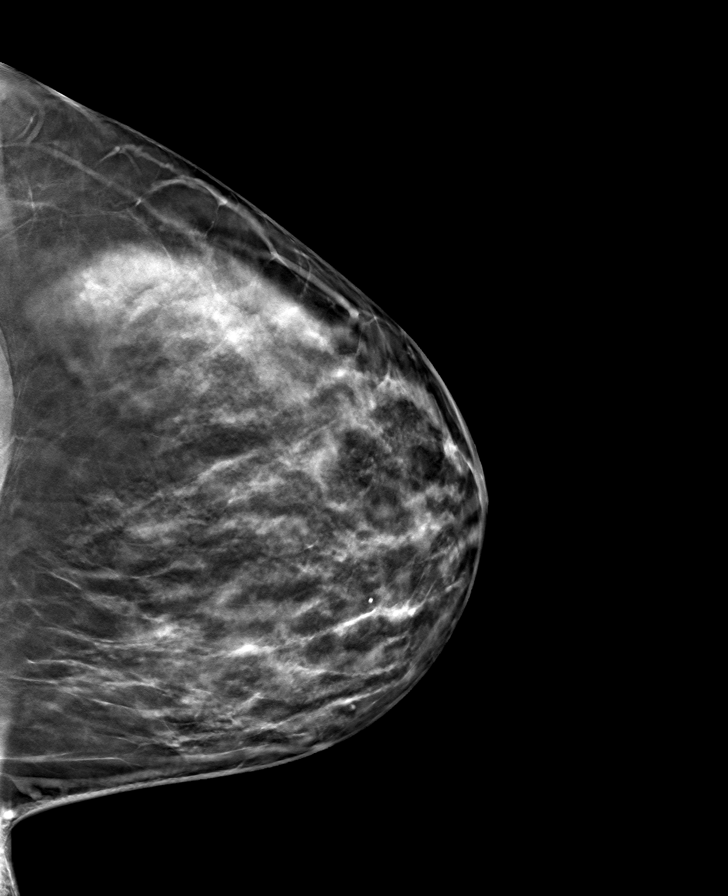

[R CC tomo · tomo slice 44/87.0]
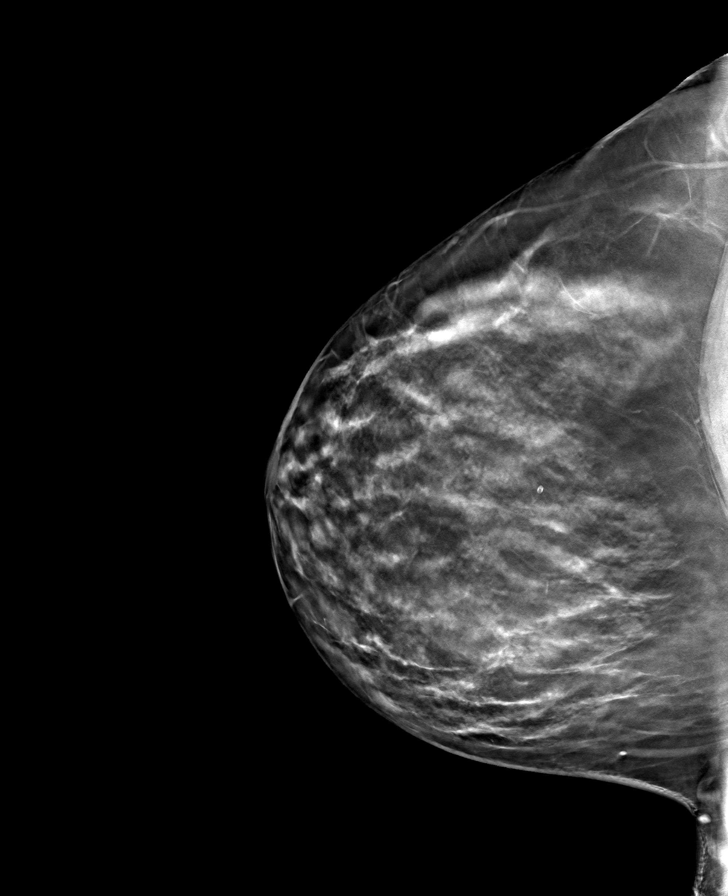

[8 of 24 positions shown; findings below may reference images not displayed]

ACR Breast Density Category c: The breast tissue is heterogeneously
dense, which may obscure small masses.
FINDINGS: There are no findings suspicious for malignancy.
IMPRESSION: No mammographic evidence of malignancy. A result letter of this
screening mammogram will be mailed directly to the patient.

RECOMMENDATION:
Screening mammogram in one year. (Code:Q3-W-BC3)

BI-RADS CATEGORY  1: Negative.

## 2023-06-27 ENCOUNTER — Other Ambulatory Visit (HOSPITAL_BASED_OUTPATIENT_CLINIC_OR_DEPARTMENT_OTHER): Payer: Self-pay

## 2023-06-27 DIAGNOSIS — R7303 Prediabetes: Secondary | ICD-10-CM | POA: Diagnosis not present

## 2023-07-11 ENCOUNTER — Other Ambulatory Visit (HOSPITAL_BASED_OUTPATIENT_CLINIC_OR_DEPARTMENT_OTHER): Payer: Self-pay

## 2023-07-11 MED ORDER — WEGOVY 0.25 MG/0.5ML ~~LOC~~ SOAJ
0.2500 mg | SUBCUTANEOUS | 0 refills | Status: AC
  Filled 2023-07-11: qty 2, 28d supply, fill #0

## 2023-07-26 ENCOUNTER — Ambulatory Visit: Admitting: Family Medicine

## 2023-07-26 ENCOUNTER — Encounter: Payer: Self-pay | Admitting: Family Medicine

## 2023-07-26 ENCOUNTER — Other Ambulatory Visit: Payer: Self-pay

## 2023-07-26 ENCOUNTER — Other Ambulatory Visit (HOSPITAL_BASED_OUTPATIENT_CLINIC_OR_DEPARTMENT_OTHER): Payer: Self-pay

## 2023-07-26 ENCOUNTER — Other Ambulatory Visit (HOSPITAL_COMMUNITY): Payer: Self-pay

## 2023-07-26 VITALS — BP 136/89 | HR 65 | Ht 69.0 in | Wt 218.0 lb

## 2023-07-26 DIAGNOSIS — N912 Amenorrhea, unspecified: Secondary | ICD-10-CM

## 2023-07-26 DIAGNOSIS — Z01419 Encounter for gynecological examination (general) (routine) without abnormal findings: Secondary | ICD-10-CM | POA: Diagnosis not present

## 2023-07-26 DIAGNOSIS — F411 Generalized anxiety disorder: Secondary | ICD-10-CM | POA: Diagnosis not present

## 2023-07-26 DIAGNOSIS — Z3202 Encounter for pregnancy test, result negative: Secondary | ICD-10-CM | POA: Diagnosis not present

## 2023-07-26 DIAGNOSIS — N951 Menopausal and female climacteric states: Secondary | ICD-10-CM

## 2023-07-26 MED ORDER — LO LOESTRIN FE 1 MG-10 MCG / 10 MCG PO TABS
1.0000 | ORAL_TABLET | Freq: Every day | ORAL | 11 refills | Status: AC
  Filled 2023-07-26: qty 28, 24d supply, fill #0
  Filled 2023-09-03: qty 28, 24d supply, fill #1
  Filled 2023-09-30: qty 28, 24d supply, fill #2
  Filled 2023-10-28 – 2023-11-04 (×2): qty 28, 24d supply, fill #3
  Filled 2023-12-12: qty 28, 24d supply, fill #4
  Filled 2024-01-06: qty 28, 24d supply, fill #5
  Filled 2024-01-29: qty 28, 24d supply, fill #6

## 2023-07-26 MED ORDER — VENLAFAXINE HCL ER 37.5 MG PO CP24
ORAL_CAPSULE | ORAL | 0 refills | Status: DC
  Filled 2023-07-26: qty 130, 40d supply, fill #0

## 2023-07-26 MED ORDER — SERTRALINE HCL 25 MG PO TABS
ORAL_TABLET | ORAL | 0 refills | Status: AC
  Filled 2023-07-26: qty 35, 15d supply, fill #0

## 2023-07-26 NOTE — Progress Notes (Signed)
 ANNUAL EXAM Patient name: [name] MRN [id number]  Date of birth: 05-20-1975 Chief Complaint:   Gynecologic Exam  History of Present Illness:   [name] is a 48 y.o. [phone] female being seen today for a routine annual exam.   No LMP recorded. Patient is perimenopausal.  Requesting to start on HRT. She reports amenorrhea x 300 days now. Having symptoms of menopause -- hot flashes, irritability, anxiety/depression, and low libido. She has done some research on HRT and is interested in initiating HRT. She has been on Zoloft  for quite some time now, does feel it is working, open to alternatives. No periods of vaginal bleeding since her last menses.  The pregnancy intention screening data noted above was reviewed. Potential methods of contraception were discussed. The patient elected to proceed with No data recorded.   Last pap 2022. Results were: NILM w/ HRHPV negative. H/O abnormal pap: no Last mammogram: 06/13/2023. Results were: normal.  Last colonoscopy: 2023. Due for repeat in 5 years.     07/26/2023    9:46 AM 01/16/2023    8:44 AM 10/30/2022   11:08 AM 08/28/2022    9:04 AM 04/18/2022    8:38 AM  Depression screen PHQ 2/9  Decreased Interest 0 0 1 0 0  Down, Depressed, Hopeless 0 1 1 1  0  PHQ - 2 Score 0 1 2 1  0  Altered sleeping 0 0 1 1 0  Tired, decreased energy 1 0 1 0 1  Change in appetite 1 1 1 2 1   Feeling bad or failure about yourself  1 2 1 1 1   Trouble concentrating 1 0 0 1 0  Moving slowly or fidgety/restless 0 1 0 0 0  Suicidal thoughts  0 0 0 0  PHQ-9 Score 4 5 6 6 3   Difficult doing work/chores   Somewhat difficult Somewhat difficult         07/26/2023    9:46 AM 01/16/2023    8:44 AM 10/30/2022   11:09 AM 08/28/2022    9:04 AM  GAD 7 : Generalized Anxiety Score  Nervous, Anxious, on Edge 1 2 2 2   Control/stop worrying 1 2 1 2   Worry too much - different things 1 2 1 2   Trouble relaxing 1 1 1 2   Restless 1 1 0 2  Easily annoyed  or irritable 1 2 1 3   Afraid - awful might happen 2 1 0 2  Total GAD 7 Score 8 11 6 15   Anxiety Difficulty  Somewhat difficult Somewhat difficult Somewhat difficult     Review of Systems:   Pertinent items are noted in HPI Denies any headaches, blurred vision, fatigue, shortness of breath, chest pain, abdominal pain, abnormal vaginal discharge/itching/odor/irritation, problems with periods, bowel movements, urination, or intercourse unless otherwise stated above. Pertinent History Reviewed:  Reviewed past medical,surgical, social and family history.  Reviewed problem list, medications and allergies. Physical Assessment:   Vitals:   07/26/23 0849  BP: 136/89  Pulse: 65  Weight: 218 lb (98.9 kg)  Height: 5' 9 (1.753 m)  Body mass index is 32.19 kg/m.        Physical Examination:   General appearance - well appearing, and in no distress  Mental status - alert, oriented to person, place, and time  Psych:  She has a normal mood and affect  Skin - warm and dry, normal color, no suspicious lesions noted  Chest - effort normal, all lung fields clear to auscultation bilaterally  Heart - normal rate and regular rhythm  Neck:  midline trachea, no thyromegaly or nodules  Breasts - Deferred  Abdomen - soft, nontender, nondistended, no masses or organomegaly  Pelvic - Deferred  Thin prep pap is not done   Extremities:  No swelling or varicosities noted   No results found for this or any previous visit (from the past 24 hours).  Assessment & Plan:  1) Well-Woman Exam: Next pap 2027  2) Perimenopause: Discussed symptomatic management of vasomotor symptoms with SNRI, which patient is interested in, will cross taper onto Effexor  and off Zoloft . Patient also interested in HRT -- given perimenopause, UPT obtained which was negative, will start Lo-lo-estrin.   Mammogram: in 1 year, or sooner if problems Colonoscopy: 2028, or sooner if problems  Orders Placed This Encounter  Procedures    POCT urine pregnancy   Pregnancy, urine POC    Meds:  Meds ordered this encounter  Medications   sertraline  (ZOLOFT ) 25 MG tablet    Sig: Take 4 tablets (100 mg total) by mouth daily for 5 days, THEN 2 tablets (50 mg total) daily for 5 days, THEN 1 tablet (25 mg total) daily for 5 days.    Dispense:  35 tablet    Refill:  0   venlafaxine  XR (EFFEXOR  XR) 37.5 MG 24 hr capsule    Sig: Take 1 capsule (37.5 mg total) by mouth daily with breakfast for 5 days, THEN 2 capsules (75 mg total) daily with breakfast for 5 days, THEN 3 capsules (112.5 mg total) daily with breakfast for 5 days, THEN 4 capsules (150 mg total) daily with breakfast for 25 days.    Dispense:  130 capsule    Refill:  0   Norethindrone-Ethinyl Estradiol -Fe Biphas (LO LOESTRIN FE ) 1 MG-10 MCG / 10 MCG tablet    Sig: Take 1 tablet by mouth daily. Take continuously.    Dispense:  28 tablet    Refill:  11    Follow-up: Return in about 1 month (around 08/26/2023) for Telehealth or virtual GYN visit.  Alain Sor, MD 07/29/2023 11:41 AM

## 2023-07-27 DIAGNOSIS — R7303 Prediabetes: Secondary | ICD-10-CM | POA: Diagnosis not present

## 2023-07-29 LAB — POCT PREGNANCY, URINE: Preg Test, Ur: NEGATIVE

## 2023-08-13 ENCOUNTER — Other Ambulatory Visit: Payer: Self-pay | Admitting: Family Medicine

## 2023-08-13 DIAGNOSIS — F411 Generalized anxiety disorder: Secondary | ICD-10-CM

## 2023-08-15 ENCOUNTER — Other Ambulatory Visit (HOSPITAL_BASED_OUTPATIENT_CLINIC_OR_DEPARTMENT_OTHER): Payer: Self-pay

## 2023-08-15 MED ORDER — WEGOVY 0.5 MG/0.5ML ~~LOC~~ SOAJ
0.5000 mg | SUBCUTANEOUS | 0 refills | Status: AC
  Filled 2023-08-15: qty 2, 28d supply, fill #0

## 2023-08-19 ENCOUNTER — Other Ambulatory Visit (HOSPITAL_BASED_OUTPATIENT_CLINIC_OR_DEPARTMENT_OTHER): Payer: Self-pay

## 2023-08-19 ENCOUNTER — Other Ambulatory Visit: Payer: Self-pay | Admitting: Family Medicine

## 2023-08-19 DIAGNOSIS — F411 Generalized anxiety disorder: Secondary | ICD-10-CM

## 2023-08-20 ENCOUNTER — Other Ambulatory Visit (HOSPITAL_BASED_OUTPATIENT_CLINIC_OR_DEPARTMENT_OTHER): Payer: Self-pay

## 2023-08-20 ENCOUNTER — Other Ambulatory Visit: Payer: Self-pay | Admitting: Family Medicine

## 2023-08-20 DIAGNOSIS — F411 Generalized anxiety disorder: Secondary | ICD-10-CM

## 2023-08-22 ENCOUNTER — Other Ambulatory Visit (HOSPITAL_BASED_OUTPATIENT_CLINIC_OR_DEPARTMENT_OTHER): Payer: Self-pay

## 2023-08-22 MED ORDER — BUPROPION HCL ER (XL) 150 MG PO TB24
150.0000 mg | ORAL_TABLET | Freq: Every morning | ORAL | 1 refills | Status: AC
  Filled 2023-08-22: qty 90, 90d supply, fill #0
  Filled 2024-01-29: qty 90, 90d supply, fill #1

## 2023-08-27 DIAGNOSIS — R7303 Prediabetes: Secondary | ICD-10-CM | POA: Diagnosis not present

## 2023-09-03 ENCOUNTER — Other Ambulatory Visit: Payer: Self-pay

## 2023-09-03 ENCOUNTER — Other Ambulatory Visit (HOSPITAL_BASED_OUTPATIENT_CLINIC_OR_DEPARTMENT_OTHER): Payer: Self-pay

## 2023-09-03 ENCOUNTER — Other Ambulatory Visit: Payer: Self-pay | Admitting: Family Medicine

## 2023-09-03 DIAGNOSIS — F411 Generalized anxiety disorder: Secondary | ICD-10-CM

## 2023-09-03 DIAGNOSIS — N951 Menopausal and female climacteric states: Secondary | ICD-10-CM

## 2023-09-03 MED ORDER — VENLAFAXINE HCL ER 150 MG PO CP24
150.0000 mg | ORAL_CAPSULE | Freq: Every day | ORAL | 2 refills | Status: AC
  Filled 2023-09-03: qty 30, 30d supply, fill #0
  Filled 2023-09-30: qty 30, 30d supply, fill #1
  Filled 2023-10-28 – 2024-01-15 (×2): qty 30, 30d supply, fill #2

## 2023-09-06 ENCOUNTER — Other Ambulatory Visit (HOSPITAL_BASED_OUTPATIENT_CLINIC_OR_DEPARTMENT_OTHER): Payer: Self-pay

## 2023-09-06 MED ORDER — WEGOVY 1 MG/0.5ML ~~LOC~~ SOAJ
1.0000 mg | SUBCUTANEOUS | 0 refills | Status: DC
  Filled 2023-09-06: qty 2, 28d supply, fill #0

## 2023-09-12 ENCOUNTER — Other Ambulatory Visit (HOSPITAL_BASED_OUTPATIENT_CLINIC_OR_DEPARTMENT_OTHER): Payer: Self-pay

## 2023-09-12 DIAGNOSIS — L718 Other rosacea: Secondary | ICD-10-CM | POA: Diagnosis not present

## 2023-09-12 DIAGNOSIS — D225 Melanocytic nevi of trunk: Secondary | ICD-10-CM | POA: Diagnosis not present

## 2023-09-12 DIAGNOSIS — D2261 Melanocytic nevi of right upper limb, including shoulder: Secondary | ICD-10-CM | POA: Diagnosis not present

## 2023-09-12 DIAGNOSIS — Z85820 Personal history of malignant melanoma of skin: Secondary | ICD-10-CM | POA: Diagnosis not present

## 2023-09-12 DIAGNOSIS — D2272 Melanocytic nevi of left lower limb, including hip: Secondary | ICD-10-CM | POA: Diagnosis not present

## 2023-09-12 DIAGNOSIS — D2271 Melanocytic nevi of right lower limb, including hip: Secondary | ICD-10-CM | POA: Diagnosis not present

## 2023-09-12 DIAGNOSIS — L814 Other melanin hyperpigmentation: Secondary | ICD-10-CM | POA: Diagnosis not present

## 2023-09-12 DIAGNOSIS — D2262 Melanocytic nevi of left upper limb, including shoulder: Secondary | ICD-10-CM | POA: Diagnosis not present

## 2023-09-12 DIAGNOSIS — D692 Other nonthrombocytopenic purpura: Secondary | ICD-10-CM | POA: Diagnosis not present

## 2023-09-12 MED ORDER — AZELAIC ACID 15 % EX GEL
1.0000 | Freq: Two times a day (BID) | CUTANEOUS | 11 refills | Status: AC
  Filled 2023-09-12: qty 50, 30d supply, fill #0
  Filled 2023-12-12: qty 50, 30d supply, fill #1

## 2023-09-27 DIAGNOSIS — R7303 Prediabetes: Secondary | ICD-10-CM | POA: Diagnosis not present

## 2023-10-16 ENCOUNTER — Other Ambulatory Visit (HOSPITAL_BASED_OUTPATIENT_CLINIC_OR_DEPARTMENT_OTHER): Payer: Self-pay

## 2023-10-16 MED ORDER — WEGOVY 1 MG/0.5ML ~~LOC~~ SOAJ
1.0000 mg | SUBCUTANEOUS | 0 refills | Status: AC
  Filled 2023-10-16: qty 2, 28d supply, fill #0

## 2023-10-27 DIAGNOSIS — R7303 Prediabetes: Secondary | ICD-10-CM | POA: Diagnosis not present

## 2023-10-29 ENCOUNTER — Other Ambulatory Visit (HOSPITAL_BASED_OUTPATIENT_CLINIC_OR_DEPARTMENT_OTHER): Payer: Self-pay

## 2023-10-29 ENCOUNTER — Other Ambulatory Visit: Payer: Self-pay

## 2023-10-29 ENCOUNTER — Encounter: Payer: Self-pay | Admitting: Pharmacist

## 2023-10-29 DIAGNOSIS — E790 Hyperuricemia without signs of inflammatory arthritis and tophaceous disease: Secondary | ICD-10-CM | POA: Diagnosis not present

## 2023-10-29 DIAGNOSIS — R7989 Other specified abnormal findings of blood chemistry: Secondary | ICD-10-CM | POA: Diagnosis not present

## 2023-10-29 DIAGNOSIS — R030 Elevated blood-pressure reading, without diagnosis of hypertension: Secondary | ICD-10-CM | POA: Diagnosis not present

## 2023-10-29 DIAGNOSIS — R739 Hyperglycemia, unspecified: Secondary | ICD-10-CM | POA: Diagnosis not present

## 2023-10-29 DIAGNOSIS — N951 Menopausal and female climacteric states: Secondary | ICD-10-CM | POA: Diagnosis not present

## 2023-10-29 DIAGNOSIS — F411 Generalized anxiety disorder: Secondary | ICD-10-CM | POA: Diagnosis not present

## 2023-10-29 MED ORDER — VENLAFAXINE HCL ER 150 MG PO CP24
150.0000 mg | ORAL_CAPSULE | Freq: Every day | ORAL | 0 refills | Status: AC
  Filled 2023-10-29 – 2023-11-04 (×2): qty 90, 90d supply, fill #0

## 2023-11-01 ENCOUNTER — Other Ambulatory Visit: Payer: Self-pay

## 2023-11-04 ENCOUNTER — Other Ambulatory Visit (HOSPITAL_BASED_OUTPATIENT_CLINIC_OR_DEPARTMENT_OTHER): Payer: Self-pay

## 2023-11-19 ENCOUNTER — Other Ambulatory Visit (HOSPITAL_BASED_OUTPATIENT_CLINIC_OR_DEPARTMENT_OTHER): Payer: Self-pay

## 2023-11-19 MED ORDER — WEGOVY 1.7 MG/0.75ML ~~LOC~~ SOAJ
1.7000 mg | SUBCUTANEOUS | 0 refills | Status: AC
  Filled 2023-11-19: qty 3, 28d supply, fill #0

## 2023-11-27 DIAGNOSIS — R7303 Prediabetes: Secondary | ICD-10-CM | POA: Diagnosis not present

## 2023-12-25 ENCOUNTER — Other Ambulatory Visit (HOSPITAL_BASED_OUTPATIENT_CLINIC_OR_DEPARTMENT_OTHER): Payer: Self-pay

## 2023-12-25 MED ORDER — WEGOVY 2.4 MG/0.75ML ~~LOC~~ SOAJ
2.4000 mg | SUBCUTANEOUS | 0 refills | Status: AC
  Filled 2023-12-25: qty 3, 28d supply, fill #0

## 2024-01-06 ENCOUNTER — Other Ambulatory Visit (HOSPITAL_BASED_OUTPATIENT_CLINIC_OR_DEPARTMENT_OTHER): Payer: Self-pay
